# Patient Record
Sex: Female | Born: 1950 | Race: White | Hispanic: No | Smoking: Never smoker
Health system: Southern US, Community
[De-identification: ages and names within clinical notes are randomized; demographics above are authoritative.]

## PROBLEM LIST (undated history)

## (undated) DIAGNOSIS — R0981 Nasal congestion: Secondary | ICD-10-CM

## (undated) DIAGNOSIS — N281 Cyst of kidney, acquired: Secondary | ICD-10-CM

## (undated) DIAGNOSIS — I471 Supraventricular tachycardia, unspecified: Secondary | ICD-10-CM

## (undated) DIAGNOSIS — Z8719 Personal history of other diseases of the digestive system: Secondary | ICD-10-CM

## (undated) DIAGNOSIS — M8580 Other specified disorders of bone density and structure, unspecified site: Secondary | ICD-10-CM

## (undated) DIAGNOSIS — M810 Age-related osteoporosis without current pathological fracture: Secondary | ICD-10-CM

## (undated) DIAGNOSIS — Z87442 Personal history of urinary calculi: Secondary | ICD-10-CM

## (undated) DIAGNOSIS — E778 Other disorders of glycoprotein metabolism: Secondary | ICD-10-CM

## (undated) DIAGNOSIS — E7800 Pure hypercholesterolemia, unspecified: Secondary | ICD-10-CM

## (undated) DIAGNOSIS — M1990 Unspecified osteoarthritis, unspecified site: Secondary | ICD-10-CM

## (undated) DIAGNOSIS — G8929 Other chronic pain: Secondary | ICD-10-CM

## (undated) DIAGNOSIS — K219 Gastro-esophageal reflux disease without esophagitis: Secondary | ICD-10-CM

## (undated) HISTORY — DX: Cyst of kidney, acquired: N28.1

## (undated) HISTORY — DX: Personal history of other diseases of the digestive system: Z87.19

## (undated) HISTORY — PX: COLONOSCOPY: SHX174

## (undated) HISTORY — DX: Pure hypercholesterolemia, unspecified: E78.00

## (undated) HISTORY — DX: Other chronic pain: G89.29

## (undated) HISTORY — DX: Gastro-esophageal reflux disease without esophagitis: K21.9

## (undated) HISTORY — DX: Age-related osteoporosis without current pathological fracture: M81.0

## (undated) HISTORY — PX: LAPAROSCOPY: SHX197

## (undated) HISTORY — DX: Other disorders of glycoprotein metabolism: E77.8

---
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
## 1998-04-03 ENCOUNTER — Ambulatory Visit (HOSPITAL_COMMUNITY): Admission: RE | Admit: 1998-04-03 | Discharge: 1998-04-03 | Payer: Self-pay | Admitting: Obstetrics and Gynecology

## 1998-04-03 ENCOUNTER — Encounter: Payer: Self-pay | Admitting: Obstetrics and Gynecology

## 1998-06-19 ENCOUNTER — Ambulatory Visit (HOSPITAL_COMMUNITY): Admission: RE | Admit: 1998-06-19 | Discharge: 1998-06-19 | Payer: Self-pay | Admitting: Obstetrics and Gynecology

## 1999-05-11 ENCOUNTER — Ambulatory Visit (HOSPITAL_COMMUNITY): Admission: RE | Admit: 1999-05-11 | Discharge: 1999-05-11 | Payer: Self-pay | Admitting: Obstetrics and Gynecology

## 1999-05-11 ENCOUNTER — Encounter: Payer: Self-pay | Admitting: Obstetrics and Gynecology

## 2000-07-11 ENCOUNTER — Ambulatory Visit (HOSPITAL_COMMUNITY): Admission: RE | Admit: 2000-07-11 | Discharge: 2000-07-11 | Payer: Self-pay | Admitting: Obstetrics and Gynecology

## 2000-07-11 ENCOUNTER — Encounter: Payer: Self-pay | Admitting: Obstetrics and Gynecology

## 2001-10-29 ENCOUNTER — Ambulatory Visit (HOSPITAL_COMMUNITY): Admission: RE | Admit: 2001-10-29 | Discharge: 2001-10-29 | Payer: Self-pay | Admitting: Obstetrics and Gynecology

## 2001-10-29 ENCOUNTER — Encounter: Payer: Self-pay | Admitting: Obstetrics and Gynecology

## 2002-03-04 ENCOUNTER — Encounter: Admission: RE | Admit: 2002-03-04 | Discharge: 2002-03-04 | Payer: Self-pay | Admitting: Obstetrics and Gynecology

## 2002-03-04 ENCOUNTER — Encounter: Payer: Self-pay | Admitting: Obstetrics and Gynecology

## 2003-01-24 ENCOUNTER — Ambulatory Visit (HOSPITAL_COMMUNITY): Admission: RE | Admit: 2003-01-24 | Discharge: 2003-01-24 | Payer: Self-pay | Admitting: Obstetrics and Gynecology

## 2003-01-24 ENCOUNTER — Encounter: Payer: Self-pay | Admitting: Obstetrics and Gynecology

## 2004-02-14 ENCOUNTER — Ambulatory Visit (HOSPITAL_COMMUNITY): Admission: RE | Admit: 2004-02-14 | Discharge: 2004-02-14 | Payer: Self-pay | Admitting: Obstetrics and Gynecology

## 2005-03-05 ENCOUNTER — Ambulatory Visit (HOSPITAL_COMMUNITY): Admission: RE | Admit: 2005-03-05 | Discharge: 2005-03-05 | Payer: Self-pay | Admitting: Obstetrics and Gynecology

## 2005-03-28 ENCOUNTER — Ambulatory Visit (HOSPITAL_COMMUNITY): Admission: RE | Admit: 2005-03-28 | Discharge: 2005-03-28 | Payer: Self-pay | Admitting: Obstetrics and Gynecology

## 2005-04-08 ENCOUNTER — Ambulatory Visit (HOSPITAL_COMMUNITY): Admission: RE | Admit: 2005-04-08 | Discharge: 2005-04-08 | Payer: Self-pay | Admitting: Gastroenterology

## 2005-09-27 ENCOUNTER — Ambulatory Visit (HOSPITAL_COMMUNITY): Admission: RE | Admit: 2005-09-27 | Discharge: 2005-09-27 | Payer: Self-pay | Admitting: Anesthesiology

## 2005-10-08 ENCOUNTER — Ambulatory Visit (HOSPITAL_COMMUNITY): Admission: RE | Admit: 2005-10-08 | Discharge: 2005-10-08 | Payer: Self-pay | Admitting: Urology

## 2006-04-09 ENCOUNTER — Ambulatory Visit (HOSPITAL_COMMUNITY): Admission: RE | Admit: 2006-04-09 | Discharge: 2006-04-09 | Payer: Self-pay | Admitting: Obstetrics and Gynecology

## 2006-07-09 ENCOUNTER — Ambulatory Visit (HOSPITAL_COMMUNITY): Admission: RE | Admit: 2006-07-09 | Discharge: 2006-07-09 | Payer: Self-pay | Admitting: Unknown Physician Specialty

## 2007-04-07 ENCOUNTER — Encounter: Admission: RE | Admit: 2007-04-07 | Discharge: 2007-04-27 | Payer: Self-pay | Admitting: Family Medicine

## 2007-04-28 ENCOUNTER — Ambulatory Visit (HOSPITAL_COMMUNITY): Admission: RE | Admit: 2007-04-28 | Discharge: 2007-04-28 | Payer: Self-pay | Admitting: Sports Medicine

## 2007-08-05 ENCOUNTER — Ambulatory Visit (HOSPITAL_COMMUNITY): Admission: RE | Admit: 2007-08-05 | Discharge: 2007-08-05 | Payer: Self-pay | Admitting: Family Medicine

## 2008-06-07 ENCOUNTER — Other Ambulatory Visit: Admission: RE | Admit: 2008-06-07 | Discharge: 2008-06-07 | Payer: Self-pay | Admitting: Family Medicine

## 2008-07-06 ENCOUNTER — Ambulatory Visit (HOSPITAL_COMMUNITY): Admission: RE | Admit: 2008-07-06 | Discharge: 2008-07-06 | Payer: Self-pay | Admitting: Family Medicine

## 2008-11-16 ENCOUNTER — Ambulatory Visit (HOSPITAL_COMMUNITY): Admission: RE | Admit: 2008-11-16 | Discharge: 2008-11-16 | Payer: Self-pay | Admitting: Family Medicine

## 2010-02-20 ENCOUNTER — Ambulatory Visit (HOSPITAL_COMMUNITY): Admission: RE | Admit: 2010-02-20 | Discharge: 2010-02-20 | Payer: Self-pay | Admitting: Family Medicine

## 2010-09-12 ENCOUNTER — Other Ambulatory Visit (HOSPITAL_COMMUNITY): Payer: Self-pay | Admitting: Family Medicine

## 2010-09-14 NOTE — Op Note (Signed)
NAME:  Sandra Park, Sandra Park               ACCOUNT NO.:  [id number]   MEDICAL RECORD NO.:  [id number]          PATIENT TYPE:  AMB   LOCATION:  ENDO                         FACILITY[facility]  [facility]   PHYSICIAN:  Shirley Friar, MDDATE OF BIRTH:  1950/12/17   DATE OF PROCEDURE:  04/08/2005  DATE OF DISCHARGE:                                 OPERATIVE REPORT   PROCEDURE:  Colonoscopy.   INDICATIONS FOR PROCEDURE:  Screening.   MEDICATIONS:  Demerol 60 mg IV, Versed 7 mg IV.   EXAM:  Rectal exam was normal.   The pediatric adjustable colonoscope was inserted into a well prepped colon  and advanced to the cecum where the ileocecal valve and appendiceal orifice  were identified. The terminal ileum was intubated and was normal in  appearance. On careful withdrawal, the colonoscope revealed normal mucosa  and no polyps or lesions noted. Retroflexion revealed small internal  hemorrhoids otherwise normal colonoscopy. Insufflated air was aspirated in  the rectum and the scope was withdrawn and confirmed the above findings.   ASSESSMENT:  Small internal hemorrhoids otherwise normal colonoscopy.   PLAN:  1.  High fiber diet as needed.  2.  Repeat colonoscopy in 10 years for screening purposes.      Shirley Friar, MD  Electronically Signed     VCS/MEDQ  D:  04/08/2005  T:  04/08/2005  Job:  [id number]   cc:   Sigmund Hazel, M.D.  Fax: [phone]

## 2010-09-19 ENCOUNTER — Other Ambulatory Visit (HOSPITAL_COMMUNITY): Payer: Self-pay | Admitting: Family Medicine

## 2010-09-19 ENCOUNTER — Ambulatory Visit (HOSPITAL_COMMUNITY)
Admission: RE | Admit: 2010-09-19 | Discharge: 2010-09-19 | Disposition: A | Discharge status: Home / Self Care | Payer: 59 | Source: Ambulatory Visit | Attending: Family Medicine | Admitting: Family Medicine

## 2010-09-19 DIAGNOSIS — Z13820 Encounter for screening for osteoporosis: Secondary | ICD-10-CM | POA: Insufficient documentation

## 2010-09-19 DIAGNOSIS — Z780 Asymptomatic menopausal state: Secondary | ICD-10-CM | POA: Insufficient documentation

## 2011-03-06 ENCOUNTER — Other Ambulatory Visit (HOSPITAL_COMMUNITY): Payer: Self-pay | Admitting: Family Medicine

## 2011-03-06 DIAGNOSIS — Z1231 Encounter for screening mammogram for malignant neoplasm of breast: Secondary | ICD-10-CM

## 2011-04-10 ENCOUNTER — Ambulatory Visit (HOSPITAL_COMMUNITY)
Admission: RE | Admit: 2011-04-10 | Discharge: 2011-04-10 | Disposition: A | Discharge status: Home / Self Care | Payer: 59 | Source: Ambulatory Visit | Attending: Family Medicine | Admitting: Family Medicine

## 2011-04-10 DIAGNOSIS — Z1231 Encounter for screening mammogram for malignant neoplasm of breast: Secondary | ICD-10-CM | POA: Insufficient documentation

## 2012-01-19 SURGERY — LEFT HEART CATH
Anesthesia: LOCAL | Laterality: Right

## 2012-03-02 ENCOUNTER — Other Ambulatory Visit (HOSPITAL_COMMUNITY): Payer: Self-pay | Admitting: Family Medicine

## 2012-03-02 DIAGNOSIS — Z1231 Encounter for screening mammogram for malignant neoplasm of breast: Secondary | ICD-10-CM

## 2012-04-10 ENCOUNTER — Ambulatory Visit (HOSPITAL_COMMUNITY): Payer: 59

## 2012-06-17 ENCOUNTER — Ambulatory Visit (HOSPITAL_COMMUNITY)
Admission: RE | Admit: 2012-06-17 | Discharge: 2012-06-17 | Disposition: A | Discharge status: Home / Self Care | Payer: 59 | Source: Ambulatory Visit | Attending: Family Medicine | Admitting: Family Medicine

## 2012-06-17 DIAGNOSIS — Z1231 Encounter for screening mammogram for malignant neoplasm of breast: Secondary | ICD-10-CM | POA: Insufficient documentation

## 2012-11-25 ENCOUNTER — Other Ambulatory Visit: Payer: Self-pay | Admitting: Family Medicine

## 2012-11-25 ENCOUNTER — Other Ambulatory Visit (HOSPITAL_COMMUNITY): Payer: Self-pay | Admitting: Family Medicine

## 2012-11-25 ENCOUNTER — Other Ambulatory Visit (HOSPITAL_COMMUNITY)
Admission: RE | Admit: 2012-11-25 | Discharge: 2012-11-25 | Disposition: A | Discharge status: Home / Self Care | Payer: 59 | Source: Ambulatory Visit | Attending: Family Medicine | Admitting: Family Medicine

## 2012-11-25 DIAGNOSIS — Z1151 Encounter for screening for human papillomavirus (HPV): Secondary | ICD-10-CM | POA: Insufficient documentation

## 2012-11-25 DIAGNOSIS — Z124 Encounter for screening for malignant neoplasm of cervix: Secondary | ICD-10-CM | POA: Insufficient documentation

## 2012-11-25 DIAGNOSIS — M8580 Other specified disorders of bone density and structure, unspecified site: Secondary | ICD-10-CM

## 2012-11-27 ENCOUNTER — Other Ambulatory Visit (HOSPITAL_COMMUNITY): Payer: Self-pay | Admitting: Family Medicine

## 2012-11-27 ENCOUNTER — Ambulatory Visit (HOSPITAL_COMMUNITY)
Admission: RE | Admit: 2012-11-27 | Discharge: 2012-11-27 | Disposition: A | Discharge status: Home / Self Care | Payer: 59 | Source: Ambulatory Visit | Attending: Family Medicine | Admitting: Family Medicine

## 2012-11-27 DIAGNOSIS — Z780 Asymptomatic menopausal state: Secondary | ICD-10-CM | POA: Insufficient documentation

## 2012-11-27 DIAGNOSIS — M8580 Other specified disorders of bone density and structure, unspecified site: Secondary | ICD-10-CM

## 2012-11-27 DIAGNOSIS — Z13820 Encounter for screening for osteoporosis: Secondary | ICD-10-CM | POA: Insufficient documentation

## 2013-05-07 ENCOUNTER — Encounter: Payer: Self-pay | Admitting: Emergency Medicine

## 2013-05-07 ENCOUNTER — Emergency Department
Admission: EM | Admit: 2013-05-07 | Discharge: 2013-05-07 | Disposition: A | Discharge status: Home / Self Care | Payer: 59 | Source: Home / Self Care | Attending: Family Medicine | Admitting: Family Medicine

## 2013-05-07 DIAGNOSIS — J069 Acute upper respiratory infection, unspecified: Secondary | ICD-10-CM

## 2013-05-07 HISTORY — DX: Unspecified osteoarthritis, unspecified site: M19.90

## 2013-05-07 HISTORY — DX: Personal history of urinary calculi: Z87.442

## 2013-05-07 HISTORY — DX: Other specified disorders of bone density and structure, unspecified site: M85.80

## 2013-05-07 MED ORDER — AZITHROMYCIN 250 MG PO TABS: ORAL_TABLET | ORAL | Status: DC

## 2013-05-07 MED ORDER — BENZONATATE 200 MG PO CAPS: 200.0000 mg | ORAL_CAPSULE | Freq: Every day | ORAL | Status: DC

## 2013-05-07 NOTE — ED Provider Notes (Signed)
CSN: [id number]     Arrival date & time 05/07/13  4193 History   First MD Initiated Contact with Patient 05/07/13 [phone]     Chief Complaint  Patient presents with  . URI      HPI Comments: Patient complains of 5 day history of sinus congestion, minimal sore throat, and non-productive cough.  She had chills last night.  The history is provided by the patient.    Past Medical History  Diagnosis Date  . History of kidney stones   . Osteopenia   . Arthritis    Past Surgical History  Procedure Laterality Date  . Laparoscopy     Family History  Problem Relation Age of Onset  . Atrial fibrillation Mother   . Hypertension Mother   . Arthritis Mother   . Atrial fibrillation Father   . Hypertension Father   . Arthritis Father    History  Substance Use Topics  . Smoking status: Never Smoker   . Smokeless tobacco: Never Used  . Alcohol Use: No   OB History   Grav Para Term Preterm Abortions TAB SAB Ect Mult Living                 Review of Systems + minimal sore throat + cough No pleuritic pain No wheezing + nasal congestion + post-nasal drainage No sinus pain/pressure No itchy/red eyes No earache, but right ear feels clogged. No hemoptysis No SOB No fever, + chills No nausea No vomiting No abdominal pain No diarrhea No urinary symptoms No skin rash + fatigue No myalgias + headache Used OTC meds without relief  Allergies  Review of patient's allergies indicates no known allergies.  Home Medications   Current Outpatient Rx  Name  Route  Sig  Dispense  Refill  . aspirin EC 81 MG tablet   Oral   Take 81 mg by mouth daily.         . Calcium Carbonate (CALCIUM 600 PO)   Oral   Take by mouth.         . Cholecalciferol (VITAMIN D-3 PO)   Oral   Take 2,000 Units by mouth.         Marland Kitchen glucosamine-chondroitin 500-400 MG tablet   Oral   Take 1 tablet by mouth 3 (three) times daily.         . Multiple Vitamin (MULTIVITAMIN) tablet   Oral   Take 1  tablet by mouth daily.         Marland Kitchen omega-3 acid ethyl esters (LOVAZA) 1 G capsule   Oral   Take by mouth 2 (two) times daily.         Marland Kitchen azithromycin (ZITHROMAX Z-PAK) 250 MG tablet      Take 2 tabs today; then begin one tab once daily for 4 more days. (Rx void after 05/15/13)   6 each   0   . benzonatate (TESSALON) 200 MG capsule   Oral   Take 1 capsule (200 mg total) by mouth at bedtime. Take as needed for cough   12 capsule   0    BP 127/70  Pulse 94  Temp(Src) 99.1 F (37.3 C) (Oral)  Resp 16  Ht 5\' 6"  (1.676 m)  Wt 116 lb (52.617 kg)  BMI 18.73 kg/m2  SpO2 97% Physical Exam Nursing notes and Vital Signs reviewed. Appearance:  Patient appears healthy, stated age, and in no acute distress Eyes:  Pupils are equal, round, and reactive to light and accomodation.  Extraocular movement is intact.  Conjunctivae are not inflamed  Ears:  Canals normal.  Tympanic membranes normal.  Nose:  Mildly congested turbinates.  No sinus tenderness.   Pharynx:  Normal Neck:  Supple.  Slightly tender shotty posterior nodes are palpated bilaterally  Lungs:  Clear to auscultation.  Breath sounds are equal.  Heart:  Regular rate and rhythm without murmurs, rubs, or gallops.  Abdomen:  Nontender without masses or hepatosplenomegaly.  Bowel sounds are present.  No CVA or flank tenderness.  Extremities:  No edema.  No calf tenderness Skin:  No rash present.   ED Course  Procedures  None    MDM   1. Acute upper respiratory infections of unspecified site; suspect viral URI    There is no evidence of bacterial infection today.  Treat symptomatically for now: Prescription written for Benzonatate [facility]) to take at bedtime for night-time cough.  Take plain Mucinex (1200 mg guaifenesin) twice daily for cough and congestion.  May add Sudafed as needed for sinus congestion.  Increase fluid intake, rest. May use Afrin nasal spray (or generic oxymetazoline) twice daily for about 5 days.  Also  recommend using saline nasal spray several times daily and saline nasal irrigation (AYR is a common brand) Stop all antihistamines for now, and other non-prescription cough/cold preparations. May take Ibuprofen 200mg , 4 tabs every 8 hours with food for headache, fever, etc. Begin Azithromycin if not improving about 5 days or if persistent fever develops (Given a prescription to hold, with an expiration date)  Follow-up with family doctor if not improving 7 to 10 days.     Kandra Nicolas, MD 05/09/13 [phone]

## 2013-05-07 NOTE — Discharge Instructions (Signed)
Take plain Mucinex (1200 mg guaifenesin) twice daily for cough and congestion.  May add Sudafed as needed for sinus congestion.  Increase fluid intake, rest. May use Afrin nasal spray (or generic oxymetazoline) twice daily for about 5 days.  Also recommend using saline nasal spray several times daily and saline nasal irrigation (AYR is a common brand) Stop all antihistamines for now, and other non-prescription cough/cold preparations. May take Ibuprofen 200mg , 4 tabs every 8 hours with food for headache, fever, etc. Begin Azithromycin if not improving about 5 days or if persistent fever develops   Follow-up with family doctor if not improving 7 to 10 days.

## 2013-05-07 NOTE — ED Notes (Signed)
Pt c/o dry cough, runny nose, congestion, "burning" in chest x 5 days. Denies fever. Received flu vac this season.

## 2013-05-13 ENCOUNTER — Other Ambulatory Visit: Payer: Self-pay | Admitting: Dermatology

## 2013-07-20 ENCOUNTER — Other Ambulatory Visit (HOSPITAL_COMMUNITY): Payer: Self-pay | Admitting: Family Medicine

## 2013-07-20 ENCOUNTER — Ambulatory Visit (HOSPITAL_COMMUNITY)
Admission: RE | Admit: 2013-07-20 | Discharge: 2013-07-20 | Disposition: A | Discharge status: Home / Self Care | Payer: 59 | Source: Ambulatory Visit | Attending: Family Medicine | Admitting: Family Medicine

## 2013-07-20 DIAGNOSIS — Z1231 Encounter for screening mammogram for malignant neoplasm of breast: Secondary | ICD-10-CM | POA: Insufficient documentation

## 2013-07-20 DIAGNOSIS — Z0000 Encounter for general adult medical examination without abnormal findings: Secondary | ICD-10-CM

## 2013-09-23 ENCOUNTER — Encounter: Payer: Self-pay | Admitting: Internal Medicine

## 2013-11-11 ENCOUNTER — Encounter: Payer: Self-pay | Admitting: Gastroenterology

## 2013-11-29 ENCOUNTER — Encounter: Payer: Self-pay | Admitting: Internal Medicine

## 2013-11-29 ENCOUNTER — Ambulatory Visit (INDEPENDENT_AMBULATORY_CARE_PROVIDER_SITE_OTHER): Payer: 59 | Admitting: Internal Medicine

## 2013-11-29 VITALS — BP 100/60 | HR 72 | Ht 66.0 in | Wt 117.0 lb

## 2013-11-29 DIAGNOSIS — E778 Other disorders of glycoprotein metabolism: Secondary | ICD-10-CM | POA: Insufficient documentation

## 2013-11-29 DIAGNOSIS — E8809 Other disorders of plasma-protein metabolism, not elsewhere classified: Secondary | ICD-10-CM

## 2013-11-29 DIAGNOSIS — R609 Edema, unspecified: Secondary | ICD-10-CM

## 2013-11-29 NOTE — Patient Instructions (Signed)
Your physician has requested that you go to the basement for the following lab work before leaving today: Stool studies  I appreciate the opportunity to care for you.

## 2013-11-29 NOTE — Progress Notes (Signed)
[facility]  [place]    [id number]    [birth year]    Assessment and Plan/Recommendations:  Hypoproteinemia:  Perform Alpha-1 antitrypsin in the stool to assess for GI loss. If elevated will need further GI evaluation Edema: Secondary to protein loss.     HPI:  Sandra Park is a 62 y.o. 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 Carlean Purl) with hx of OA and GERD referred from [facility] at [facility] for evaluation of hypoproteinemia.  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  She was seen by her PCP with unremarkable findings. In Nov-Dec of 2014 pt began to notice peripheral edema greater in her Left leg. 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  She was seen by her PCP in April and work up showed low protein.  She added more protein to her diet with some relief, having a corrected protein level on a f/u visit, which again dropped on the next visit.  She was evaluated for loss of protein in urine with a 24 hr protein test which proved negative, so was referred for GI evaluation.  Pt denies N/V, Diarrhea, greasy or foul smelling stools, hematochezia, melana, hematemesis, recent weight loss fever, ABD pain, and night sweats.  She does c/o occasional chills while at work, increase flatulence, and occasional dizzy spells relieved by hydration.  She has had normal EKG and chest x rays, and has no other CHF related complaints.  She also denies urinary changes.  09/05/2010 Total protein 7 albumin 4.9 07/28/2013 NL CBC, TSH Total protein is 4.8 and albumin 3.1 (NLS are 6-8.3 and 3.4-4.8) 08/04/2013 - TP 4.5 albumin 2.9 08/09/2013 no protein in 24 hr urine 08/16/2013 TP 4.9, alb 3.4 09/13/2013 TP 4.5 and alb 3   Outpatient Encounter Prescriptions as of 11/29/2013  Medication Sig  . aspirin EC 81 MG tablet Take 81 mg by mouth daily.  . Calcium Carbonate  (CALCIUM 600 PO) Take by mouth.  . Cholecalciferol (VITAMIN D-3 PO) Take 2,000 Units by mouth.  . famotidine (PEPCID) 20 MG tablet Take 20 mg by mouth daily.  . furosemide (LASIX) 20 MG tablet Take 20 mg by mouth.  Marland Kitchen glucosamine-chondroitin 500-400 MG tablet Take 1 tablet by mouth 3 (three) times daily.  . Multiple Vitamin (MULTIVITAMIN) tablet Take 1 tablet by mouth daily.  Marland Kitchen omega-3 acid ethyl esters (LOVAZA) 1 G capsule Take by mouth 2 (two) times daily.  . vitamin C (ASCORBIC ACID) 500 MG tablet Take 500 mg by mouth daily.  . [DISCONTINUED] azithromycin (ZITHROMAX Z-PAK) 250 MG tablet Take 2 tabs today; then begin one tab once daily for 4 more days. (Rx void after 05/15/13)  . [DISCONTINUED] benzonatate (TESSALON) 200 MG capsule Take 1 capsule (200 mg total) by mouth at bedtime. Take as needed for cough    Allergies as of 11/29/2013  . (No Known Allergies)    Past Medical History  Diagnosis Date  . History of kidney stones   . Osteopenia   . Arthritis   . Hypoproteinemia   . GERD (gastroesophageal reflux disease)   . Osteoporosis   . Renal cyst     Past Surgical History  Procedure Laterality Date  . Laparoscopy Left     salpingoopherectomy cyst x2    Family History  Problem Relation Age of Onset  . Atrial fibrillation Mother   .  Hypertension Mother   . Arthritis Mother   . Atrial fibrillation Father   . Hypertension Father   . Arthritis Father   . Heart attack Father   . Clotting disorder Father     DVT's  . Pneumonia Mother   . Suicidality Paternal Grandmother   . Hypertension Maternal Grandfather   . Heart disease Maternal Grandfather     History   Social History  . Marital Status: Married    Spouse Name: N/A    Number of Children: N/A  . Years of Education: N/A   Occupational History  . Not on file.   Social History Main Topics  . Smoking status: Never Smoker   . Smokeless tobacco: Never Used  . Alcohol Use: No  . Drug Use: No  . Sexual  Activity: Not on file   Other Topics Concern  . 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[employer]   No children   3-4 caffeine drinks/day          Review of systems: Positive for: bloating, flatulence, and occasional chills All other ROS negative or as per HPI  Physical Exam: BP 100/60  Pulse 72  Ht 5\' 6"  (1.676 m)  Wt 117 lb (53.071 kg)  BMI 18.89 kg/m2 Constitutional: WDWN NAD Eyes: anicteric Mouth: oral and posterior pharynx free of lesions Neck: supple, no mass or thyromegaly Lungs: clear to auscultation bilaterally Cardiovascular: S1S2 with regular rate and rhythm, no rubs murmurs or gallops Abdomen: soft, nontender, nondistended, no masses or organomegaly, normal bowel sounds Extremities: 2+ peripheral edema, no joint deformities from OA. 2+ radial pulse 1+ DP pulse. Skin: no rash Neuro: alert and oriented x 3 Psych: normal mood and affect  Data Reviewed: Lab results from PCP. Normal 24 hr urine protein Good cholesterol panel.  Oda Kilts,  [place], [place] Student  11:01 AM       I have personally seen the patient, reviewed and repeated key elements of the history and physical and participated in formation of the assessment and plan the student has documented.   I appreciate the opportunity to care for her.  Gatha Mayer, MD, [facility]   IO:MBTDHR,CBUL Jeani Hawking, MD

## 2013-11-30 ENCOUNTER — Other Ambulatory Visit: Payer: 59

## 2013-11-30 DIAGNOSIS — E778 Other disorders of glycoprotein metabolism: Secondary | ICD-10-CM

## 2013-12-04 LAB — ALPHA-1-ANTITRYPSIN, STOOL: Alpha-1-Antitrypsin, Feces: <25 mg/dL (ref <55)

## 2013-12-06 NOTE — Progress Notes (Signed)
Quick Note:  Let her know that she is not losing protein from her gut. No further GI work-up. Please also cc her PCP ______

## 2014-08-16 ENCOUNTER — Other Ambulatory Visit (HOSPITAL_COMMUNITY): Payer: Self-pay | Admitting: Family Medicine

## 2014-08-16 DIAGNOSIS — Z1231 Encounter for screening mammogram for malignant neoplasm of breast: Secondary | ICD-10-CM

## 2014-08-18 ENCOUNTER — Ambulatory Visit (HOSPITAL_COMMUNITY)
Admission: RE | Admit: 2014-08-18 | Discharge: 2014-08-18 | Disposition: A | Discharge status: Home / Self Care | Payer: 59 | Source: Ambulatory Visit | Attending: Family Medicine | Admitting: Family Medicine

## 2014-08-18 DIAGNOSIS — Z1231 Encounter for screening mammogram for malignant neoplasm of breast: Secondary | ICD-10-CM | POA: Diagnosis not present

## 2014-09-06 DIAGNOSIS — Z8719 Personal history of other diseases of the digestive system: Secondary | ICD-10-CM

## 2014-09-06 HISTORY — DX: Personal history of other diseases of the digestive system: Z87.19

## 2014-10-24 ENCOUNTER — Other Ambulatory Visit: Payer: Self-pay

## 2014-10-25 ENCOUNTER — Telehealth: Payer: Self-pay | Admitting: Internal Medicine

## 2014-10-26 ENCOUNTER — Encounter: Payer: Self-pay | Admitting: Internal Medicine

## 2014-10-26 NOTE — Telephone Encounter (Signed)
Yes OK to set up a direct colonoscopy and can use screening as dx

## 2014-10-27 ENCOUNTER — Encounter: Payer: Self-pay | Admitting: Internal Medicine

## 2014-10-27 NOTE — Telephone Encounter (Signed)
Appointment scheduled w/pt for 12-22-14.

## 2014-12-12 ENCOUNTER — Telehealth: Payer: Self-pay | Admitting: *Deleted

## 2014-12-12 ENCOUNTER — Ambulatory Visit (AMBULATORY_SURGERY_CENTER): Payer: Self-pay | Admitting: *Deleted

## 2014-12-12 VITALS — Ht 66.0 in | Wt 120.0 lb

## 2014-12-12 DIAGNOSIS — R634 Abnormal weight loss: Secondary | ICD-10-CM | POA: Insufficient documentation

## 2014-12-12 DIAGNOSIS — M25569 Pain in unspecified knee: Secondary | ICD-10-CM | POA: Insufficient documentation

## 2014-12-12 DIAGNOSIS — R350 Frequency of micturition: Secondary | ICD-10-CM | POA: Insufficient documentation

## 2014-12-12 DIAGNOSIS — K219 Gastro-esophageal reflux disease without esophagitis: Secondary | ICD-10-CM | POA: Insufficient documentation

## 2014-12-12 DIAGNOSIS — Z1211 Encounter for screening for malignant neoplasm of colon: Secondary | ICD-10-CM

## 2014-12-12 DIAGNOSIS — M810 Age-related osteoporosis without current pathological fracture: Secondary | ICD-10-CM | POA: Insufficient documentation

## 2014-12-12 NOTE — Telephone Encounter (Signed)
Patient here in pre-visit, she is for screening colonoscopy on 12/22/14. Patient has seen you in office 2015. Patient had last colon with [facility] in 2006. Patient states she is unable to drink anything flavored, unable to drink Gatorade,etc... She states last colon she had the miralax mixed with water only and she did fine. Okay for patient to use just water with miralax or any suggestions? Thanks, Tamarick Kovalcik pv

## 2014-12-12 NOTE — Progress Notes (Signed)
Patient denies any allergies to eggs or soy. Patient denies any problems with anesthesia/sedation. Patient denies any oxygen use at home and does not take any diet/weight loss medications. EMMI education assisgned to patient on colonoscopy, this was explained and instructions given to patient. 

## 2014-12-13 ENCOUNTER — Telehealth: Payer: Self-pay | Admitting: *Deleted

## 2014-12-13 NOTE — Telephone Encounter (Signed)
Called pt back, and advised Dr Carlean Purl ok'd to mix miralax in water.-adm

## 2014-12-13 NOTE — Telephone Encounter (Signed)
OK to take MiraLax with water

## 2014-12-14 NOTE — Telephone Encounter (Signed)
Noted. Thanks! See phone note by April,RN.

## 2014-12-22 ENCOUNTER — Ambulatory Visit (AMBULATORY_SURGERY_CENTER): Payer: 59 | Admitting: Internal Medicine

## 2014-12-22 ENCOUNTER — Encounter: Payer: Self-pay | Admitting: Internal Medicine

## 2014-12-22 VITALS — BP 138/58 | HR 64 | Temp 97.6°F | Resp 15 | Ht 66.0 in | Wt 120.0 lb

## 2014-12-22 DIAGNOSIS — Z1211 Encounter for screening for malignant neoplasm of colon: Secondary | ICD-10-CM

## 2014-12-22 MED ORDER — SODIUM CHLORIDE 0.9 % IV SOLN: 500.0000 mL | INTRAVENOUS | Status: DC

## 2014-12-22 NOTE — Op Note (Signed)
[place]  [address]. [place], [id number]   COLONOSCOPY PROCEDURE REPORT  PATIENT: Sandra, Park  MR#: [id number] BIRTHDATE: December 22, 1950 , 36  yrs. old GENDER: female ENDOSCOPIST: Gatha Mayer, MD, [facility] PROCEDURE DATE:  12/22/2014 PROCEDURE:   Colonoscopy, screening First Screening Colonoscopy - Avg.  risk and is 50 yrs.  old or older - No.  Prior Negative Screening - Now for repeat screening. 10 or more years since last screening  History of Adenoma - Now for follow-up colonoscopy & has been > or = to 3 yrs.  N/A  Polyps removed today? No Recommend repeat exam, <10 yrs? No ASA CLASS:   Class II INDICATIONS:Screening for colonic neoplasia and Colorectal Neoplasm Risk Assessment for this procedure is average risk. MEDICATIONS: Propofol 200 mg IV and Monitored anesthesia care  DESCRIPTION OF PROCEDURE:   After the risks benefits and alternatives of the procedure were thoroughly explained, informed consent was obtained.  The digital rectal exam revealed no abnormalities of the rectum.   The [id number]  endoscope was introduced through the anus and advanced to the terminal ileum which was intubated for a short distance. No adverse events experienced.   The quality of the prep was excellent.  (MiraLax was used)  The instrument was then slowly withdrawn as the colon was fully examined. Estimated blood loss is zero unless otherwise noted in this procedure report.      COLON FINDINGS: A normal appearing cecum, ileocecal valve, and appendiceal orifice were identified.  The ascending, transverse, descending, sigmoid colon, and rectum appeared unremarkable.   The examined terminal ileum appeared to be normal.  Retroflexed views revealed no abnormalities. The time to cecum = 3.7 Withdrawal time = 6.9   The scope was withdrawn and the procedure completed. COMPLICATIONS: There were no immediate complications.  ENDOSCOPIC IMPRESSION: Normal colonoscopy  and terminal ileum exam. excellent prep  RECOMMENDATIONS: Repeat colonoscopy/screening test 10 years - 2026  eSigned:  Gatha Mayer, MD, [facility] 12/22/2014 2:02 PM   cc: Kathyrn Lass, MD and The Patient

## 2014-12-22 NOTE — Progress Notes (Signed)
Report to PACU, RN, vss, BBS= Clear.  

## 2014-12-22 NOTE — Patient Instructions (Addendum)
Normal exam!  Next routine colonoscopy/screening test in 10 years - 2026  I appreciate the opportunity to care for you. Gatha Mayer, MD, FACG  YOU HAD AN ENDOSCOPIC PROCEDURE TODAY AT [facility]:   Refer to the procedure report that was given to you for any specific questions about what was found during the examination.  If the procedure report does not answer your questions, please call your gastroenterologist to clarify.  If you requested that your care partner not be given the details of your procedure findings, then the procedure report has been included in a sealed envelope for you to review at your convenience later.  YOU SHOULD EXPECT: Some feelings of bloating in the abdomen. Passage of more gas than usual.  Walking can help get rid of the air that was put into your GI tract during the procedure and reduce the bloating. If you had a lower endoscopy (such as a colonoscopy or flexible sigmoidoscopy) you may notice spotting of blood in your stool or on the toilet paper. If you underwent a bowel prep for your procedure, you may not have a normal bowel movement for a few days.  Please Note:  You might notice some irritation and congestion in your nose or some drainage.  This is from the oxygen used during your procedure.  There is no need for concern and it should clear up in a day or so.  SYMPTOMS TO REPORT IMMEDIATELY:   Following lower endoscopy (colonoscopy or flexible sigmoidoscopy):  Excessive amounts of blood in the stool  Significant tenderness or worsening of abdominal pains  Swelling of the abdomen that is new, acute  Fever of 100F or higher  For urgent or emergent issues, a gastroenterologist can be reached at any hour by calling [phone].   DIET: Your first meal following the procedure should be a small meal and then it is ok to progress to your normal diet. Heavy or fried foods are harder to digest and may make you feel nauseous or  bloated.  Likewise, meals heavy in dairy and vegetables can increase bloating.  Drink plenty of fluids but you should avoid alcoholic beverages for 24 hours.  ACTIVITY:  You should plan to take it easy for the rest of today and you should NOT DRIVE or use heavy machinery until tomorrow (because of the sedation medicines used during the test).    FOLLOW UP: Our staff will call the number listed on your records the next business day following your procedure to check on you and address any questions or concerns that you may have regarding the information given to you following your procedure. If we do not reach you, we will leave a message.  However, if you are feeling well and you are not experiencing any problems, there is no need to return our call.  We will assume that you have returned to your regular daily activities without incident.  If any biopsies were taken you will be contacted by phone or by letter within the next 1-3 weeks.  Please call us at [phone] if you have not heard about the biopsies in 3 weeks.    SIGNATURES/CONFIDENTIALITY: You and/or your care partner have signed paperwork which will be entered into your electronic medical record.  These signatures attest to the fact that that the information above on your After Visit Summary has been reviewed and is understood.  Full responsibility of the confidentiality of this discharge information lies with you and/or  your care-partner.  Next colonoscopy in 10 years.

## 2014-12-23 ENCOUNTER — Telehealth: Payer: Self-pay | Admitting: *Deleted

## 2014-12-23 NOTE — Telephone Encounter (Signed)
  Follow up Call-  Call back number 12/22/2014  Post procedure Call Back phone  # [phone]  Permission to leave phone message Yes     Patient questions:  Do you have a fever, pain , or abdominal swelling? No. Pain Score  0 *  Have you tolerated food without any problems? Yes.    Have you been able to return to your normal activities? Yes.    Do you have any questions about your discharge instructions: Diet   No. Medications  No. Follow up visit  No.  Do you have questions or concerns about your Care? No.  Actions: * If pain score is 4 or above: No action needed, pain <4.

## 2015-08-11 ENCOUNTER — Other Ambulatory Visit: Payer: Self-pay

## 2015-08-11 DIAGNOSIS — Z1231 Encounter for screening mammogram for malignant neoplasm of breast: Secondary | ICD-10-CM

## 2015-08-29 ENCOUNTER — Emergency Department (HOSPITAL_COMMUNITY)
Admission: EM | Admit: 2015-08-29 | Discharge: 2015-08-29 | Disposition: A | Discharge status: Home / Self Care | Payer: 59 | Attending: Emergency Medicine | Admitting: Emergency Medicine

## 2015-08-29 ENCOUNTER — Other Ambulatory Visit: Payer: Self-pay | Admitting: Physician Assistant

## 2015-08-29 ENCOUNTER — Encounter (HOSPITAL_COMMUNITY): Payer: Self-pay | Admitting: Emergency Medicine

## 2015-08-29 ENCOUNTER — Emergency Department (HOSPITAL_COMMUNITY): Payer: 59

## 2015-08-29 DIAGNOSIS — Z7982 Long term (current) use of aspirin: Secondary | ICD-10-CM | POA: Insufficient documentation

## 2015-08-29 DIAGNOSIS — I471 Supraventricular tachycardia: Secondary | ICD-10-CM

## 2015-08-29 DIAGNOSIS — Z79899 Other long term (current) drug therapy: Secondary | ICD-10-CM | POA: Insufficient documentation

## 2015-08-29 DIAGNOSIS — M810 Age-related osteoporosis without current pathological fracture: Secondary | ICD-10-CM | POA: Diagnosis not present

## 2015-08-29 DIAGNOSIS — M1990 Unspecified osteoarthritis, unspecified site: Secondary | ICD-10-CM | POA: Insufficient documentation

## 2015-08-29 DIAGNOSIS — M8580 Other specified disorders of bone density and structure, unspecified site: Secondary | ICD-10-CM | POA: Diagnosis not present

## 2015-08-29 DIAGNOSIS — R0602 Shortness of breath: Secondary | ICD-10-CM | POA: Diagnosis not present

## 2015-08-29 DIAGNOSIS — I498 Other specified cardiac arrhythmias: Secondary | ICD-10-CM | POA: Diagnosis not present

## 2015-08-29 DIAGNOSIS — K219 Gastro-esophageal reflux disease without esophagitis: Secondary | ICD-10-CM | POA: Diagnosis not present

## 2015-08-29 HISTORY — DX: Supraventricular tachycardia: I47.1

## 2015-08-29 HISTORY — DX: Supraventricular tachycardia, unspecified: I47.10

## 2015-08-29 LAB — CBC
HEMATOCRIT: 37.5 % (ref 36.0–46.0)
HEMOGLOBIN: 12.8 g/dL (ref 12.0–15.0)
MCH: 30 pg (ref 26.0–34.0)
MCHC: 34.1 g/dL (ref 30.0–36.0)
MCV: 87.8 fL (ref 78.0–100.0)
Platelets: 214 10*3/uL (ref 150–400)
RBC: 4.27 MIL/uL (ref 3.87–5.11)
RDW: 12.8 % (ref 11.5–15.5)
WBC: 7.3 10*3/uL (ref 4.0–10.5)

## 2015-08-29 LAB — BASIC METABOLIC PANEL
ANION GAP: 11 (ref 5–15)
BUN: 21 mg/dL — ABNORMAL HIGH (ref 6–20)
CALCIUM: 10 mg/dL (ref 8.9–10.3)
CO2: 22 mmol/L (ref 22–32)
Chloride: 105 mmol/L (ref 101–111)
Creatinine, Ser: 0.82 mg/dL (ref 0.44–1.00)
Glucose, Bld: 164 mg/dL — ABNORMAL HIGH (ref 65–99)
POTASSIUM: 4 mmol/L (ref 3.5–5.1)
Sodium: 138 mmol/L (ref 135–145)

## 2015-08-29 LAB — I-STAT TROPONIN, ED: TROPONIN I, POC: 0 ng/mL (ref 0.00–0.08)

## 2015-08-29 LAB — TSH: TSH: 1.123 u[IU]/mL (ref 0.350–4.500)

## 2015-08-29 MED ORDER — METOPROLOL SUCCINATE ER 25 MG PO TB24: 12.5000 mg | ORAL_TABLET | Freq: Every day | ORAL | Status: DC

## 2015-08-29 MED FILL — METOPROLOL SUCC ER 25 MG TA: 25 | 60 days supply | Qty: 30 | Fill #0

## 2015-08-29 NOTE — ED Notes (Signed)
Patient began to feel short of breath today. Patient has a hx of SVT. Patient's heart rate currently 100.

## 2015-08-29 NOTE — ED Provider Notes (Signed)
CSN: [id number]     Arrival date & time 08/29/15  [id number] History   First MD Initiated Contact with Patient 08/29/15 0900     Chief Complaint  Patient presents with  . Shortness of Breath     (Consider location/radiation/quality/duration/timing/severity/associated sxs/prior Treatment) HPI Comments: Patient here after having episodes of SVT prior to arrival. This was self diagnosed as a patient felt her heart beating rapid. She uses vagal maneuvers which made her symptoms better. This is her third episode of having these symptoms but she has not been evaluated by her physician. Denies any associated chest pain or shortness of breath. Does drink 2-3 cups of coffee a day. No recent fever or illness. 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 It resolved and when her heart rate was checked it was in the 70s. She went to her car and had a second episode and was again used to maneuvers which resolved her symptoms. Symptoms seem to be spontaneous and nothing makes them worse.  Patient is a 65 y.o. female presenting with shortness of breath. The history is provided by the patient.  Shortness of Breath   Past Medical History  Diagnosis Date  . History of kidney stones   . Osteopenia   . Arthritis   . Hypoproteinemia ([facility])   . GERD (gastroesophageal reflux disease)   . Osteoporosis   . Renal cyst   . History of colitis 09/06/2014  . SVT (supraventricular tachycardia) [facility])    Past Surgical History  Procedure Laterality Date  . Laparoscopy Left 1989,& 90's    salpingoopherectomy cyst x2  . Colonoscopy  2006, 2016    normal   Family History  Problem Relation Age of Onset  . Atrial fibrillation Mother   . Hypertension Mother   . Arthritis Mother   . Pneumonia Mother   . Atrial fibrillation Father   . Hypertension Father   . Arthritis Father   . Heart attack Father   . Clotting disorder Father     DVT's  . Suicidality Paternal Grandmother   . Hypertension  Maternal Grandfather   . Heart disease Maternal Grandfather   . Colon cancer Neg Hx    Social History  Substance Use Topics  . Smoking status: Never Smoker   . Smokeless tobacco: Never Used  . Alcohol Use: No   OB History    No data available     Review of Systems  Respiratory: Positive for shortness of breath.   All other systems reviewed and are negative.     Allergies  Review of patient's allergies indicates no known allergies.  Home Medications   Prior to Admission medications   Medication Sig Start Date End Date Taking? Authorizing Provider  aspirin EC 81 MG tablet Take 81 mg by mouth daily.    Historical Provider, MD  bisacodyl (BISACODYL) 5 MG EC tablet Take 5 mg by mouth once. Per prep    Historical Provider, MD  Calcium Carbonate (CALCIUM 600 PO) Take by mouth.    Historical Provider, MD  Cholecalciferol (VITAMIN D-3 PO) Take 2,000 Units by mouth.    Historical Provider, MD  famotidine (PEPCID) 20 MG tablet Take 20 mg by mouth daily.    Historical Provider, MD  glucosamine-chondroitin 500-400 MG tablet Take 1 tablet by mouth 3 (three) times daily.    Historical Provider, MD  Multiple Vitamin (MULTIVITAMIN) tablet Take 1 tablet by mouth daily.    Historical Provider, MD  omega-3 acid  ethyl esters (LOVAZA) 1 G capsule Take by mouth 2 (two) times daily.    Historical Provider, MD  vitamin C (ASCORBIC ACID) 500 MG tablet Take 500 mg by mouth daily.    Historical Provider, MD   BP 154/83 mmHg  Pulse 87  Temp(Src) 97.9 F (36.6 C) (Oral)  Resp 12  SpO2 100% Physical Exam  Constitutional: She is oriented to person, place, and time. She appears well-developed and well-nourished.  Non-toxic appearance. No distress.  HENT:  Head: Normocephalic and atraumatic.  Eyes: Conjunctivae, EOM and lids are normal. Pupils are equal, round, and reactive to light.  Neck: Normal range of motion. Neck supple. No tracheal deviation present. No thyroid mass present.  Cardiovascular:  Normal rate, regular rhythm and normal heart sounds.  Exam reveals no gallop.   No murmur heard. Pulmonary/Chest: Effort normal and breath sounds normal. No stridor. No respiratory distress. She has no decreased breath sounds. She has no wheezes. She has no rhonchi. She has no rales.  Abdominal: Soft. Normal appearance and bowel sounds are normal. She exhibits no distension. There is no tenderness. There is no rebound and no CVA tenderness.  Musculoskeletal: Normal range of motion. She exhibits no edema or tenderness.  Neurological: She is alert and oriented to person, place, and time. She has normal strength. No cranial nerve deficit or sensory deficit. GCS eye subscore is 4. GCS verbal subscore is 5. GCS motor subscore is 6.  Skin: Skin is warm and dry. No abrasion and no rash noted.  Psychiatric: She has a normal mood and affect. Her speech is normal and behavior is normal.  Nursing note and vitals reviewed.   ED Course  Procedures (including critical care time) Labs Review Labs Reviewed  CBC  BASIC METABOLIC PANEL  TSH  I-STAT TROPOININ, ED    Imaging Review No results found. I have personally reviewed and evaluated these images and lab results as part of my medical decision-making.   EKG Interpretation   Date/Time:  Tuesday Aug 29 2015 08:39:07 EDT Ventricular Rate:  94 PR Interval:  148 QRS Duration: 99 QT Interval:  373 QTC Calculation: 466 R Axis:   89 Text Interpretation:  Sinus rhythm Borderline right axis deviation Minimal  ST depression, diffuse leads Confirmed by Akeyla Molden  MD, Nashya Garlington ([id number]) on  08/29/2015 9:01:15 AM      MDM   Final diagnoses:  None    Patient monitored here and no signs of cardiac arrhythmia noted. Her TSH is within normal limits. Hemoglobin stable. Discussed case with Dr. Burt Knack from cardiology and patient will be started on Toprol XL at bedtime and he will have his office call her to schedule a 21 day event monitor    Lacretia Leigh,  MD 08/29/15 1337

## 2015-08-29 NOTE — Discharge Instructions (Signed)
Paroxysmal Supraventricular Tachycardia Paroxysmal supraventricular tachycardia (PSVT) is a type of abnormal heart rhythm. It causes your heart to beat very quickly and then suddenly stop beating so quickly. A normal heart rate is 60-100 beats per minute. During an episode of PSVT, your heart rate may be 150-250 beats per minute. This can make you feel light-headed and short of breath. An episode of PSVT can be frightening. It is usually not dangerous. The heart has four chambers. All chambers need to work together for the heart to beat effectively. A normal heartbeat usually starts in the right upper chamber of the heart (atrium) when an area (sinoatrial node) puts out an electrical signal that spreads to the other chambers. People with PSVT may have abnormal electrical pathways, or they may have other areas in the upper chambers that send out electrical signals. The result is a very rapid heartbeat. When your heart beats very quickly, it does not have time to fill completely with blood. When PSVT happens often or it lasts for long periods, it can lead to heart weakness and failure. Most people with PSVT do not have any other heart disease. CAUSES Abnormal electrical activity in the heart causes PSVT. It is not known why some people get PSVT and others do not. RISK FACTORS You may be more likely to have PSVT if:  You are 20-30 years old.  You are a woman. Other factors that may increase your chances of an attack include:  Stress.  Being tired.  Smoking.  Stimulant drugs.  Alcoholic drinks.  Caffeine.  Pregnancy. SIGNS AND SYMPTOMS A mild episode of PSVT may cause no symptoms. If you do have signs and symptoms, they may include:  A pounding heart.  Feeling of skipped heartbeats (palpitations).  Weakness.  Shortness of breath.  Tightness or pain in your chest.  Light-headedness.  Anxiety.  Dizziness.  Sweating.  Nausea.  A fainting spell. DIAGNOSIS Your health care  provider may suspect PSVT if you have symptoms that come and go. The health care provider will do a physical exam. If you are having an episode during the exam, the health care provider may be able to diagnose PSVT by listening to your heart and feeling your pulse. Tests may also be done, including:  An electrical study of your heart (electrocardiogram, or ECG).  A test in which you wear a portable ECG monitor all day (Holter monitor) or for several days (event monitor).  A test that involves taking an image of your heart using sound waves (echocardiogram) to rule out other causes of a fast heart rate. TREATMENT You may not need treatment if episodes of PSVT do not happen often or if they do not cause symptoms. If PSVT episodes do cause symptoms, your health care provider may first suggest trying a self-treatment called vagus nerve stimulation. The vagus nerve extends down from the brain. It regulates certain body functions. Stimulating this nerve can slow down the heart. Your health care provider can teach you ways to do this. You may need to try a few ways to find what works best for you. Options include:  Holding your breath and pushing, as though you are having a bowel movement.  Massaging an area on one side of your neck below your jaw.  Bending forward with your head between your legs.  Bending forward with your head between your legs and coughing.  Massaging your eyeballs with your eyes closed. If vagus nerve stimulation does not work, other treatment options include:    Medicines to prevent an attack.  Being treated in the hospital with medicine or electric shock to stop an attack (cardioversion). This treatment can include:  Getting medicine through an IV line.  Having a small electric shock delivered to your heart. You will be given medicine to make you sleep through this procedure.  If you have frequent episodes with symptoms, you may need a procedure to get rid of the faulty  areas of your heart (radiofrequency ablation) and end the episodes of PSVT. In this procedure:  A long, thin tube (catheter) is passed through one of your veins into your heart.  Energy directed through the catheter eliminates the areas of your heart that are causing abnormal electric stimulation. HOME CARE INSTRUCTIONS  Take medicines only as directed by your health care provider.  Do not use caffeine in any form if caffeine triggers episodes of PSVT. Otherwise, consume caffeine in moderation. This means no more than a few cups of coffee or the equivalent each day.  Do not drink alcohol if alcohol triggers episodes of PSVT. Otherwise, limit alcohol intake to no more than 1 drink per day for nonpregnant women and 2 drinks per day for men. One drink equals 12 ounces of beer, 5 ounces of wine, or 1 ounces of hard liquor.  Do not use any tobacco products, including cigarettes, chewing tobacco, or electronic cigarettes. If you need help quitting, ask your health care provider.  Try to get at least 7 hours of sleep each night.  Find healthy ways to manage stress.  Perform vagus nerve stimulation as directed by your health care provider.  Maintain a healthy weight.  Get some exercise on most days. Ask your health care provider to suggest some good activities for you. SEEK MEDICAL CARE IF:  You are having episodes of PSVT more often, or they are lasting longer.  Vagus nerve stimulation is no longer helping.  You have new symptoms during an episode. SEEK IMMEDIATE MEDICAL CARE IF:  You have chest pain or trouble breathing.  You have an episode of PSVT that has lasted longer than 20 minutes.  You have passed out from an episode of PSVT. These symptoms may represent a serious problem that is an emergency. Do not wait to see if the symptoms will go away. Get medical help right away. Call your local emergency services (911 in the [place] Do not drive yourself to the hospital.   This  information is not intended to replace advice given to you by your health care provider. Make sure you discuss any questions you have with your health care provider.   Document Released: 04/15/2005 Document Revised: 05/06/2014 Document Reviewed: 09/23/2013 Elsevier Interactive Patient Education 2016 [employer]  

## 2015-08-30 ENCOUNTER — Ambulatory Visit
Admission: RE | Admit: 2015-08-30 | Discharge: 2015-08-30 | Disposition: A | Discharge status: Home / Self Care | Payer: 59 | Source: Ambulatory Visit

## 2015-08-30 DIAGNOSIS — Z1231 Encounter for screening mammogram for malignant neoplasm of breast: Secondary | ICD-10-CM | POA: Diagnosis not present

## 2015-09-05 ENCOUNTER — Ambulatory Visit (INDEPENDENT_AMBULATORY_CARE_PROVIDER_SITE_OTHER): Payer: 59

## 2015-09-05 DIAGNOSIS — I471 Supraventricular tachycardia: Secondary | ICD-10-CM

## 2015-09-15 DIAGNOSIS — D1801 Hemangioma of skin and subcutaneous tissue: Secondary | ICD-10-CM | POA: Diagnosis not present

## 2015-09-15 DIAGNOSIS — L821 Other seborrheic keratosis: Secondary | ICD-10-CM | POA: Diagnosis not present

## 2015-09-15 DIAGNOSIS — D225 Melanocytic nevi of trunk: Secondary | ICD-10-CM | POA: Diagnosis not present

## 2015-09-15 DIAGNOSIS — D2261 Melanocytic nevi of right upper limb, including shoulder: Secondary | ICD-10-CM | POA: Diagnosis not present

## 2015-09-15 DIAGNOSIS — D2271 Melanocytic nevi of right lower limb, including hip: Secondary | ICD-10-CM | POA: Diagnosis not present

## 2015-09-15 DIAGNOSIS — D2272 Melanocytic nevi of left lower limb, including hip: Secondary | ICD-10-CM | POA: Diagnosis not present

## 2015-09-15 DIAGNOSIS — L814 Other melanin hyperpigmentation: Secondary | ICD-10-CM | POA: Diagnosis not present

## 2015-09-15 DIAGNOSIS — D2372 Other benign neoplasm of skin of left lower limb, including hip: Secondary | ICD-10-CM | POA: Diagnosis not present

## 2015-10-12 ENCOUNTER — Encounter: Payer: Self-pay | Admitting: Cardiovascular Disease

## 2015-10-12 NOTE — Telephone Encounter (Signed)
This encounter was created in error - please disregard.

## 2015-10-12 NOTE — Telephone Encounter (Signed)
New message    Patient calling back for monitor results

## 2015-11-13 DIAGNOSIS — N281 Cyst of kidney, acquired: Secondary | ICD-10-CM | POA: Diagnosis not present

## 2015-11-14 DIAGNOSIS — H5213 Myopia, bilateral: Secondary | ICD-10-CM | POA: Diagnosis not present

## 2015-12-19 DIAGNOSIS — Z23 Encounter for immunization: Secondary | ICD-10-CM | POA: Diagnosis not present

## 2015-12-19 DIAGNOSIS — M859 Disorder of bone density and structure, unspecified: Secondary | ICD-10-CM | POA: Diagnosis not present

## 2015-12-19 DIAGNOSIS — M25561 Pain in right knee: Secondary | ICD-10-CM | POA: Diagnosis not present

## 2015-12-19 DIAGNOSIS — Z0000 Encounter for general adult medical examination without abnormal findings: Secondary | ICD-10-CM | POA: Diagnosis not present

## 2015-12-19 DIAGNOSIS — M25562 Pain in left knee: Secondary | ICD-10-CM | POA: Diagnosis not present

## 2016-02-12 DIAGNOSIS — M5412 Radiculopathy, cervical region: Secondary | ICD-10-CM | POA: Diagnosis not present

## 2016-02-12 MED FILL — predniSONE 20 MG TABS: 20 | 5 days supply | Qty: 10 | Fill #0

## 2016-02-20 DIAGNOSIS — M9903 Segmental and somatic dysfunction of lumbar region: Secondary | ICD-10-CM | POA: Diagnosis not present

## 2016-02-20 DIAGNOSIS — M50122 Cervical disc disorder at C5-C6 level with radiculopathy: Secondary | ICD-10-CM | POA: Diagnosis not present

## 2016-02-20 DIAGNOSIS — M9904 Segmental and somatic dysfunction of sacral region: Secondary | ICD-10-CM | POA: Diagnosis not present

## 2016-02-20 DIAGNOSIS — M9902 Segmental and somatic dysfunction of thoracic region: Secondary | ICD-10-CM | POA: Diagnosis not present

## 2016-02-20 DIAGNOSIS — M9905 Segmental and somatic dysfunction of pelvic region: Secondary | ICD-10-CM | POA: Diagnosis not present

## 2016-02-20 DIAGNOSIS — Q72812 Congenital shortening of left lower limb: Secondary | ICD-10-CM | POA: Diagnosis not present

## 2016-02-20 DIAGNOSIS — M5136 Other intervertebral disc degeneration, lumbar region: Secondary | ICD-10-CM | POA: Diagnosis not present

## 2016-02-20 DIAGNOSIS — M9901 Segmental and somatic dysfunction of cervical region: Secondary | ICD-10-CM | POA: Diagnosis not present

## 2016-02-20 DIAGNOSIS — M461 Sacroiliitis, not elsewhere classified: Secondary | ICD-10-CM | POA: Diagnosis not present

## 2016-02-22 DIAGNOSIS — M9901 Segmental and somatic dysfunction of cervical region: Secondary | ICD-10-CM | POA: Diagnosis not present

## 2016-02-22 DIAGNOSIS — M5136 Other intervertebral disc degeneration, lumbar region: Secondary | ICD-10-CM | POA: Diagnosis not present

## 2016-02-22 DIAGNOSIS — M9904 Segmental and somatic dysfunction of sacral region: Secondary | ICD-10-CM | POA: Diagnosis not present

## 2016-02-22 DIAGNOSIS — M9903 Segmental and somatic dysfunction of lumbar region: Secondary | ICD-10-CM | POA: Diagnosis not present

## 2016-02-22 DIAGNOSIS — M9905 Segmental and somatic dysfunction of pelvic region: Secondary | ICD-10-CM | POA: Diagnosis not present

## 2016-02-22 DIAGNOSIS — Q72812 Congenital shortening of left lower limb: Secondary | ICD-10-CM | POA: Diagnosis not present

## 2016-02-22 DIAGNOSIS — M50122 Cervical disc disorder at C5-C6 level with radiculopathy: Secondary | ICD-10-CM | POA: Diagnosis not present

## 2016-02-22 DIAGNOSIS — M461 Sacroiliitis, not elsewhere classified: Secondary | ICD-10-CM | POA: Diagnosis not present

## 2016-02-22 DIAGNOSIS — M9902 Segmental and somatic dysfunction of thoracic region: Secondary | ICD-10-CM | POA: Diagnosis not present

## 2016-02-28 DIAGNOSIS — Q72812 Congenital shortening of left lower limb: Secondary | ICD-10-CM | POA: Diagnosis not present

## 2016-02-28 DIAGNOSIS — M50122 Cervical disc disorder at C5-C6 level with radiculopathy: Secondary | ICD-10-CM | POA: Diagnosis not present

## 2016-02-28 DIAGNOSIS — M461 Sacroiliitis, not elsewhere classified: Secondary | ICD-10-CM | POA: Diagnosis not present

## 2016-02-28 DIAGNOSIS — M9903 Segmental and somatic dysfunction of lumbar region: Secondary | ICD-10-CM | POA: Diagnosis not present

## 2016-02-28 DIAGNOSIS — M9904 Segmental and somatic dysfunction of sacral region: Secondary | ICD-10-CM | POA: Diagnosis not present

## 2016-02-28 DIAGNOSIS — M9902 Segmental and somatic dysfunction of thoracic region: Secondary | ICD-10-CM | POA: Diagnosis not present

## 2016-02-28 DIAGNOSIS — M5136 Other intervertebral disc degeneration, lumbar region: Secondary | ICD-10-CM | POA: Diagnosis not present

## 2016-02-28 DIAGNOSIS — M9905 Segmental and somatic dysfunction of pelvic region: Secondary | ICD-10-CM | POA: Diagnosis not present

## 2016-02-28 DIAGNOSIS — M9901 Segmental and somatic dysfunction of cervical region: Secondary | ICD-10-CM | POA: Diagnosis not present

## 2016-02-29 DIAGNOSIS — M9903 Segmental and somatic dysfunction of lumbar region: Secondary | ICD-10-CM | POA: Diagnosis not present

## 2016-02-29 DIAGNOSIS — M9901 Segmental and somatic dysfunction of cervical region: Secondary | ICD-10-CM | POA: Diagnosis not present

## 2016-02-29 DIAGNOSIS — M5136 Other intervertebral disc degeneration, lumbar region: Secondary | ICD-10-CM | POA: Diagnosis not present

## 2016-02-29 DIAGNOSIS — M50122 Cervical disc disorder at C5-C6 level with radiculopathy: Secondary | ICD-10-CM | POA: Diagnosis not present

## 2016-02-29 DIAGNOSIS — M9904 Segmental and somatic dysfunction of sacral region: Secondary | ICD-10-CM | POA: Diagnosis not present

## 2016-02-29 DIAGNOSIS — M461 Sacroiliitis, not elsewhere classified: Secondary | ICD-10-CM | POA: Diagnosis not present

## 2016-02-29 DIAGNOSIS — M9905 Segmental and somatic dysfunction of pelvic region: Secondary | ICD-10-CM | POA: Diagnosis not present

## 2016-02-29 DIAGNOSIS — M9902 Segmental and somatic dysfunction of thoracic region: Secondary | ICD-10-CM | POA: Diagnosis not present

## 2016-02-29 DIAGNOSIS — Q72812 Congenital shortening of left lower limb: Secondary | ICD-10-CM | POA: Diagnosis not present

## 2016-03-05 DIAGNOSIS — M461 Sacroiliitis, not elsewhere classified: Secondary | ICD-10-CM | POA: Diagnosis not present

## 2016-03-05 DIAGNOSIS — M9903 Segmental and somatic dysfunction of lumbar region: Secondary | ICD-10-CM | POA: Diagnosis not present

## 2016-03-05 DIAGNOSIS — M50122 Cervical disc disorder at C5-C6 level with radiculopathy: Secondary | ICD-10-CM | POA: Diagnosis not present

## 2016-03-05 DIAGNOSIS — M9902 Segmental and somatic dysfunction of thoracic region: Secondary | ICD-10-CM | POA: Diagnosis not present

## 2016-03-05 DIAGNOSIS — M9901 Segmental and somatic dysfunction of cervical region: Secondary | ICD-10-CM | POA: Diagnosis not present

## 2016-03-05 DIAGNOSIS — M5136 Other intervertebral disc degeneration, lumbar region: Secondary | ICD-10-CM | POA: Diagnosis not present

## 2016-03-05 DIAGNOSIS — M9904 Segmental and somatic dysfunction of sacral region: Secondary | ICD-10-CM | POA: Diagnosis not present

## 2016-03-05 DIAGNOSIS — Q72812 Congenital shortening of left lower limb: Secondary | ICD-10-CM | POA: Diagnosis not present

## 2016-03-05 DIAGNOSIS — M9905 Segmental and somatic dysfunction of pelvic region: Secondary | ICD-10-CM | POA: Diagnosis not present

## 2016-03-06 DIAGNOSIS — M47819 Spondylosis without myelopathy or radiculopathy, site unspecified: Secondary | ICD-10-CM | POA: Diagnosis not present

## 2016-03-06 DIAGNOSIS — M5032 Other cervical disc degeneration, mid-cervical region, unspecified level: Secondary | ICD-10-CM | POA: Diagnosis not present

## 2016-03-07 DIAGNOSIS — M461 Sacroiliitis, not elsewhere classified: Secondary | ICD-10-CM | POA: Diagnosis not present

## 2016-03-07 DIAGNOSIS — M9902 Segmental and somatic dysfunction of thoracic region: Secondary | ICD-10-CM | POA: Diagnosis not present

## 2016-03-07 DIAGNOSIS — Q72812 Congenital shortening of left lower limb: Secondary | ICD-10-CM | POA: Diagnosis not present

## 2016-03-07 DIAGNOSIS — M9904 Segmental and somatic dysfunction of sacral region: Secondary | ICD-10-CM | POA: Diagnosis not present

## 2016-03-07 DIAGNOSIS — M5136 Other intervertebral disc degeneration, lumbar region: Secondary | ICD-10-CM | POA: Diagnosis not present

## 2016-03-07 DIAGNOSIS — M9901 Segmental and somatic dysfunction of cervical region: Secondary | ICD-10-CM | POA: Diagnosis not present

## 2016-03-07 DIAGNOSIS — M9903 Segmental and somatic dysfunction of lumbar region: Secondary | ICD-10-CM | POA: Diagnosis not present

## 2016-03-07 DIAGNOSIS — M50122 Cervical disc disorder at C5-C6 level with radiculopathy: Secondary | ICD-10-CM | POA: Diagnosis not present

## 2016-03-07 DIAGNOSIS — M9905 Segmental and somatic dysfunction of pelvic region: Secondary | ICD-10-CM | POA: Diagnosis not present

## 2016-03-08 DIAGNOSIS — M9904 Segmental and somatic dysfunction of sacral region: Secondary | ICD-10-CM | POA: Diagnosis not present

## 2016-03-08 DIAGNOSIS — Q72812 Congenital shortening of left lower limb: Secondary | ICD-10-CM | POA: Diagnosis not present

## 2016-03-08 DIAGNOSIS — M461 Sacroiliitis, not elsewhere classified: Secondary | ICD-10-CM | POA: Diagnosis not present

## 2016-03-08 DIAGNOSIS — M5136 Other intervertebral disc degeneration, lumbar region: Secondary | ICD-10-CM | POA: Diagnosis not present

## 2016-03-08 DIAGNOSIS — M9901 Segmental and somatic dysfunction of cervical region: Secondary | ICD-10-CM | POA: Diagnosis not present

## 2016-03-08 DIAGNOSIS — M9902 Segmental and somatic dysfunction of thoracic region: Secondary | ICD-10-CM | POA: Diagnosis not present

## 2016-03-08 DIAGNOSIS — M9903 Segmental and somatic dysfunction of lumbar region: Secondary | ICD-10-CM | POA: Diagnosis not present

## 2016-03-08 DIAGNOSIS — M50122 Cervical disc disorder at C5-C6 level with radiculopathy: Secondary | ICD-10-CM | POA: Diagnosis not present

## 2016-03-08 DIAGNOSIS — M9905 Segmental and somatic dysfunction of pelvic region: Secondary | ICD-10-CM | POA: Diagnosis not present

## 2016-03-11 DIAGNOSIS — M9903 Segmental and somatic dysfunction of lumbar region: Secondary | ICD-10-CM | POA: Diagnosis not present

## 2016-03-11 DIAGNOSIS — M5136 Other intervertebral disc degeneration, lumbar region: Secondary | ICD-10-CM | POA: Diagnosis not present

## 2016-03-11 DIAGNOSIS — M461 Sacroiliitis, not elsewhere classified: Secondary | ICD-10-CM | POA: Diagnosis not present

## 2016-03-11 DIAGNOSIS — M9901 Segmental and somatic dysfunction of cervical region: Secondary | ICD-10-CM | POA: Diagnosis not present

## 2016-03-11 DIAGNOSIS — M9905 Segmental and somatic dysfunction of pelvic region: Secondary | ICD-10-CM | POA: Diagnosis not present

## 2016-03-11 DIAGNOSIS — Q72812 Congenital shortening of left lower limb: Secondary | ICD-10-CM | POA: Diagnosis not present

## 2016-03-11 DIAGNOSIS — M9904 Segmental and somatic dysfunction of sacral region: Secondary | ICD-10-CM | POA: Diagnosis not present

## 2016-03-11 DIAGNOSIS — M9902 Segmental and somatic dysfunction of thoracic region: Secondary | ICD-10-CM | POA: Diagnosis not present

## 2016-03-11 DIAGNOSIS — M50122 Cervical disc disorder at C5-C6 level with radiculopathy: Secondary | ICD-10-CM | POA: Diagnosis not present

## 2016-03-12 DIAGNOSIS — M9902 Segmental and somatic dysfunction of thoracic region: Secondary | ICD-10-CM | POA: Diagnosis not present

## 2016-03-12 DIAGNOSIS — M50122 Cervical disc disorder at C5-C6 level with radiculopathy: Secondary | ICD-10-CM | POA: Diagnosis not present

## 2016-03-12 DIAGNOSIS — M9901 Segmental and somatic dysfunction of cervical region: Secondary | ICD-10-CM | POA: Diagnosis not present

## 2016-03-12 DIAGNOSIS — Q72812 Congenital shortening of left lower limb: Secondary | ICD-10-CM | POA: Diagnosis not present

## 2016-03-12 DIAGNOSIS — M9905 Segmental and somatic dysfunction of pelvic region: Secondary | ICD-10-CM | POA: Diagnosis not present

## 2016-03-12 DIAGNOSIS — M9903 Segmental and somatic dysfunction of lumbar region: Secondary | ICD-10-CM | POA: Diagnosis not present

## 2016-03-12 DIAGNOSIS — M5136 Other intervertebral disc degeneration, lumbar region: Secondary | ICD-10-CM | POA: Diagnosis not present

## 2016-03-12 DIAGNOSIS — M461 Sacroiliitis, not elsewhere classified: Secondary | ICD-10-CM | POA: Diagnosis not present

## 2016-03-12 DIAGNOSIS — M9904 Segmental and somatic dysfunction of sacral region: Secondary | ICD-10-CM | POA: Diagnosis not present

## 2016-03-14 DIAGNOSIS — M461 Sacroiliitis, not elsewhere classified: Secondary | ICD-10-CM | POA: Diagnosis not present

## 2016-03-14 DIAGNOSIS — M9903 Segmental and somatic dysfunction of lumbar region: Secondary | ICD-10-CM | POA: Diagnosis not present

## 2016-03-14 DIAGNOSIS — M9902 Segmental and somatic dysfunction of thoracic region: Secondary | ICD-10-CM | POA: Diagnosis not present

## 2016-03-14 DIAGNOSIS — Q72812 Congenital shortening of left lower limb: Secondary | ICD-10-CM | POA: Diagnosis not present

## 2016-03-14 DIAGNOSIS — M50122 Cervical disc disorder at C5-C6 level with radiculopathy: Secondary | ICD-10-CM | POA: Diagnosis not present

## 2016-03-14 DIAGNOSIS — M5136 Other intervertebral disc degeneration, lumbar region: Secondary | ICD-10-CM | POA: Diagnosis not present

## 2016-03-14 DIAGNOSIS — M9901 Segmental and somatic dysfunction of cervical region: Secondary | ICD-10-CM | POA: Diagnosis not present

## 2016-03-14 DIAGNOSIS — M9904 Segmental and somatic dysfunction of sacral region: Secondary | ICD-10-CM | POA: Diagnosis not present

## 2016-03-14 DIAGNOSIS — M9905 Segmental and somatic dysfunction of pelvic region: Secondary | ICD-10-CM | POA: Diagnosis not present

## 2016-03-18 DIAGNOSIS — M50122 Cervical disc disorder at C5-C6 level with radiculopathy: Secondary | ICD-10-CM | POA: Diagnosis not present

## 2016-03-18 DIAGNOSIS — M9905 Segmental and somatic dysfunction of pelvic region: Secondary | ICD-10-CM | POA: Diagnosis not present

## 2016-03-18 DIAGNOSIS — M9902 Segmental and somatic dysfunction of thoracic region: Secondary | ICD-10-CM | POA: Diagnosis not present

## 2016-03-18 DIAGNOSIS — M461 Sacroiliitis, not elsewhere classified: Secondary | ICD-10-CM | POA: Diagnosis not present

## 2016-03-18 DIAGNOSIS — Q72812 Congenital shortening of left lower limb: Secondary | ICD-10-CM | POA: Diagnosis not present

## 2016-03-18 DIAGNOSIS — M9903 Segmental and somatic dysfunction of lumbar region: Secondary | ICD-10-CM | POA: Diagnosis not present

## 2016-03-18 DIAGNOSIS — M9901 Segmental and somatic dysfunction of cervical region: Secondary | ICD-10-CM | POA: Diagnosis not present

## 2016-03-18 DIAGNOSIS — M9904 Segmental and somatic dysfunction of sacral region: Secondary | ICD-10-CM | POA: Diagnosis not present

## 2016-03-18 DIAGNOSIS — M5136 Other intervertebral disc degeneration, lumbar region: Secondary | ICD-10-CM | POA: Diagnosis not present

## 2016-03-19 DIAGNOSIS — M9905 Segmental and somatic dysfunction of pelvic region: Secondary | ICD-10-CM | POA: Diagnosis not present

## 2016-03-19 DIAGNOSIS — M461 Sacroiliitis, not elsewhere classified: Secondary | ICD-10-CM | POA: Diagnosis not present

## 2016-03-19 DIAGNOSIS — M9902 Segmental and somatic dysfunction of thoracic region: Secondary | ICD-10-CM | POA: Diagnosis not present

## 2016-03-19 DIAGNOSIS — M5136 Other intervertebral disc degeneration, lumbar region: Secondary | ICD-10-CM | POA: Diagnosis not present

## 2016-03-19 DIAGNOSIS — M50122 Cervical disc disorder at C5-C6 level with radiculopathy: Secondary | ICD-10-CM | POA: Diagnosis not present

## 2016-03-19 DIAGNOSIS — M9904 Segmental and somatic dysfunction of sacral region: Secondary | ICD-10-CM | POA: Diagnosis not present

## 2016-03-19 DIAGNOSIS — M9903 Segmental and somatic dysfunction of lumbar region: Secondary | ICD-10-CM | POA: Diagnosis not present

## 2016-03-19 DIAGNOSIS — M9901 Segmental and somatic dysfunction of cervical region: Secondary | ICD-10-CM | POA: Diagnosis not present

## 2016-03-19 DIAGNOSIS — Q72812 Congenital shortening of left lower limb: Secondary | ICD-10-CM | POA: Diagnosis not present

## 2016-03-25 DIAGNOSIS — M50122 Cervical disc disorder at C5-C6 level with radiculopathy: Secondary | ICD-10-CM | POA: Diagnosis not present

## 2016-03-25 DIAGNOSIS — M9903 Segmental and somatic dysfunction of lumbar region: Secondary | ICD-10-CM | POA: Diagnosis not present

## 2016-03-25 DIAGNOSIS — M9904 Segmental and somatic dysfunction of sacral region: Secondary | ICD-10-CM | POA: Diagnosis not present

## 2016-03-25 DIAGNOSIS — Q72812 Congenital shortening of left lower limb: Secondary | ICD-10-CM | POA: Diagnosis not present

## 2016-03-25 DIAGNOSIS — M5136 Other intervertebral disc degeneration, lumbar region: Secondary | ICD-10-CM | POA: Diagnosis not present

## 2016-03-25 DIAGNOSIS — M9902 Segmental and somatic dysfunction of thoracic region: Secondary | ICD-10-CM | POA: Diagnosis not present

## 2016-03-25 DIAGNOSIS — M461 Sacroiliitis, not elsewhere classified: Secondary | ICD-10-CM | POA: Diagnosis not present

## 2016-03-25 DIAGNOSIS — M9905 Segmental and somatic dysfunction of pelvic region: Secondary | ICD-10-CM | POA: Diagnosis not present

## 2016-03-25 DIAGNOSIS — M9901 Segmental and somatic dysfunction of cervical region: Secondary | ICD-10-CM | POA: Diagnosis not present

## 2016-03-27 DIAGNOSIS — M461 Sacroiliitis, not elsewhere classified: Secondary | ICD-10-CM | POA: Diagnosis not present

## 2016-03-27 DIAGNOSIS — M9905 Segmental and somatic dysfunction of pelvic region: Secondary | ICD-10-CM | POA: Diagnosis not present

## 2016-03-27 DIAGNOSIS — M5136 Other intervertebral disc degeneration, lumbar region: Secondary | ICD-10-CM | POA: Diagnosis not present

## 2016-03-27 DIAGNOSIS — Q72812 Congenital shortening of left lower limb: Secondary | ICD-10-CM | POA: Diagnosis not present

## 2016-03-27 DIAGNOSIS — M9901 Segmental and somatic dysfunction of cervical region: Secondary | ICD-10-CM | POA: Diagnosis not present

## 2016-03-27 DIAGNOSIS — M9903 Segmental and somatic dysfunction of lumbar region: Secondary | ICD-10-CM | POA: Diagnosis not present

## 2016-03-27 DIAGNOSIS — M9902 Segmental and somatic dysfunction of thoracic region: Secondary | ICD-10-CM | POA: Diagnosis not present

## 2016-03-27 DIAGNOSIS — M50122 Cervical disc disorder at C5-C6 level with radiculopathy: Secondary | ICD-10-CM | POA: Diagnosis not present

## 2016-03-27 DIAGNOSIS — M9904 Segmental and somatic dysfunction of sacral region: Secondary | ICD-10-CM | POA: Diagnosis not present

## 2016-03-28 DIAGNOSIS — M9905 Segmental and somatic dysfunction of pelvic region: Secondary | ICD-10-CM | POA: Diagnosis not present

## 2016-03-28 DIAGNOSIS — M5136 Other intervertebral disc degeneration, lumbar region: Secondary | ICD-10-CM | POA: Diagnosis not present

## 2016-03-28 DIAGNOSIS — Q72812 Congenital shortening of left lower limb: Secondary | ICD-10-CM | POA: Diagnosis not present

## 2016-03-28 DIAGNOSIS — M9902 Segmental and somatic dysfunction of thoracic region: Secondary | ICD-10-CM | POA: Diagnosis not present

## 2016-03-28 DIAGNOSIS — M9903 Segmental and somatic dysfunction of lumbar region: Secondary | ICD-10-CM | POA: Diagnosis not present

## 2016-03-28 DIAGNOSIS — M9904 Segmental and somatic dysfunction of sacral region: Secondary | ICD-10-CM | POA: Diagnosis not present

## 2016-03-28 DIAGNOSIS — M461 Sacroiliitis, not elsewhere classified: Secondary | ICD-10-CM | POA: Diagnosis not present

## 2016-03-28 DIAGNOSIS — M50122 Cervical disc disorder at C5-C6 level with radiculopathy: Secondary | ICD-10-CM | POA: Diagnosis not present

## 2016-03-28 DIAGNOSIS — M9901 Segmental and somatic dysfunction of cervical region: Secondary | ICD-10-CM | POA: Diagnosis not present

## 2016-04-03 DIAGNOSIS — M9905 Segmental and somatic dysfunction of pelvic region: Secondary | ICD-10-CM | POA: Diagnosis not present

## 2016-04-03 DIAGNOSIS — M9903 Segmental and somatic dysfunction of lumbar region: Secondary | ICD-10-CM | POA: Diagnosis not present

## 2016-04-03 DIAGNOSIS — Q72812 Congenital shortening of left lower limb: Secondary | ICD-10-CM | POA: Diagnosis not present

## 2016-04-03 DIAGNOSIS — M9902 Segmental and somatic dysfunction of thoracic region: Secondary | ICD-10-CM | POA: Diagnosis not present

## 2016-04-03 DIAGNOSIS — M9901 Segmental and somatic dysfunction of cervical region: Secondary | ICD-10-CM | POA: Diagnosis not present

## 2016-04-03 DIAGNOSIS — M461 Sacroiliitis, not elsewhere classified: Secondary | ICD-10-CM | POA: Diagnosis not present

## 2016-04-03 DIAGNOSIS — M50122 Cervical disc disorder at C5-C6 level with radiculopathy: Secondary | ICD-10-CM | POA: Diagnosis not present

## 2016-04-03 DIAGNOSIS — M5136 Other intervertebral disc degeneration, lumbar region: Secondary | ICD-10-CM | POA: Diagnosis not present

## 2016-04-03 DIAGNOSIS — M9904 Segmental and somatic dysfunction of sacral region: Secondary | ICD-10-CM | POA: Diagnosis not present

## 2016-04-04 DIAGNOSIS — M9904 Segmental and somatic dysfunction of sacral region: Secondary | ICD-10-CM | POA: Diagnosis not present

## 2016-04-04 DIAGNOSIS — M5136 Other intervertebral disc degeneration, lumbar region: Secondary | ICD-10-CM | POA: Diagnosis not present

## 2016-04-04 DIAGNOSIS — M9902 Segmental and somatic dysfunction of thoracic region: Secondary | ICD-10-CM | POA: Diagnosis not present

## 2016-04-04 DIAGNOSIS — M9903 Segmental and somatic dysfunction of lumbar region: Secondary | ICD-10-CM | POA: Diagnosis not present

## 2016-04-04 DIAGNOSIS — Q72812 Congenital shortening of left lower limb: Secondary | ICD-10-CM | POA: Diagnosis not present

## 2016-04-04 DIAGNOSIS — M50122 Cervical disc disorder at C5-C6 level with radiculopathy: Secondary | ICD-10-CM | POA: Diagnosis not present

## 2016-04-04 DIAGNOSIS — M9905 Segmental and somatic dysfunction of pelvic region: Secondary | ICD-10-CM | POA: Diagnosis not present

## 2016-04-04 DIAGNOSIS — M461 Sacroiliitis, not elsewhere classified: Secondary | ICD-10-CM | POA: Diagnosis not present

## 2016-04-04 DIAGNOSIS — M9901 Segmental and somatic dysfunction of cervical region: Secondary | ICD-10-CM | POA: Diagnosis not present

## 2016-04-09 DIAGNOSIS — M9902 Segmental and somatic dysfunction of thoracic region: Secondary | ICD-10-CM | POA: Diagnosis not present

## 2016-04-09 DIAGNOSIS — M9903 Segmental and somatic dysfunction of lumbar region: Secondary | ICD-10-CM | POA: Diagnosis not present

## 2016-04-09 DIAGNOSIS — Q72812 Congenital shortening of left lower limb: Secondary | ICD-10-CM | POA: Diagnosis not present

## 2016-04-09 DIAGNOSIS — M9904 Segmental and somatic dysfunction of sacral region: Secondary | ICD-10-CM | POA: Diagnosis not present

## 2016-04-09 DIAGNOSIS — M5136 Other intervertebral disc degeneration, lumbar region: Secondary | ICD-10-CM | POA: Diagnosis not present

## 2016-04-09 DIAGNOSIS — M50122 Cervical disc disorder at C5-C6 level with radiculopathy: Secondary | ICD-10-CM | POA: Diagnosis not present

## 2016-04-09 DIAGNOSIS — M461 Sacroiliitis, not elsewhere classified: Secondary | ICD-10-CM | POA: Diagnosis not present

## 2016-04-09 DIAGNOSIS — M9901 Segmental and somatic dysfunction of cervical region: Secondary | ICD-10-CM | POA: Diagnosis not present

## 2016-04-09 DIAGNOSIS — M9905 Segmental and somatic dysfunction of pelvic region: Secondary | ICD-10-CM | POA: Diagnosis not present

## 2016-04-17 DIAGNOSIS — M9903 Segmental and somatic dysfunction of lumbar region: Secondary | ICD-10-CM | POA: Diagnosis not present

## 2016-04-17 DIAGNOSIS — M9905 Segmental and somatic dysfunction of pelvic region: Secondary | ICD-10-CM | POA: Diagnosis not present

## 2016-04-17 DIAGNOSIS — M5136 Other intervertebral disc degeneration, lumbar region: Secondary | ICD-10-CM | POA: Diagnosis not present

## 2016-04-17 DIAGNOSIS — M9904 Segmental and somatic dysfunction of sacral region: Secondary | ICD-10-CM | POA: Diagnosis not present

## 2016-04-17 DIAGNOSIS — M9902 Segmental and somatic dysfunction of thoracic region: Secondary | ICD-10-CM | POA: Diagnosis not present

## 2016-04-17 DIAGNOSIS — M9901 Segmental and somatic dysfunction of cervical region: Secondary | ICD-10-CM | POA: Diagnosis not present

## 2016-04-17 DIAGNOSIS — Q72812 Congenital shortening of left lower limb: Secondary | ICD-10-CM | POA: Diagnosis not present

## 2016-04-17 DIAGNOSIS — M461 Sacroiliitis, not elsewhere classified: Secondary | ICD-10-CM | POA: Diagnosis not present

## 2016-04-17 DIAGNOSIS — M50122 Cervical disc disorder at C5-C6 level with radiculopathy: Secondary | ICD-10-CM | POA: Diagnosis not present

## 2016-05-01 DIAGNOSIS — M9903 Segmental and somatic dysfunction of lumbar region: Secondary | ICD-10-CM | POA: Diagnosis not present

## 2016-05-01 DIAGNOSIS — M5136 Other intervertebral disc degeneration, lumbar region: Secondary | ICD-10-CM | POA: Diagnosis not present

## 2016-05-01 DIAGNOSIS — M461 Sacroiliitis, not elsewhere classified: Secondary | ICD-10-CM | POA: Diagnosis not present

## 2016-05-01 DIAGNOSIS — M9904 Segmental and somatic dysfunction of sacral region: Secondary | ICD-10-CM | POA: Diagnosis not present

## 2016-05-01 DIAGNOSIS — M9902 Segmental and somatic dysfunction of thoracic region: Secondary | ICD-10-CM | POA: Diagnosis not present

## 2016-05-01 DIAGNOSIS — M9901 Segmental and somatic dysfunction of cervical region: Secondary | ICD-10-CM | POA: Diagnosis not present

## 2016-05-01 DIAGNOSIS — M50122 Cervical disc disorder at C5-C6 level with radiculopathy: Secondary | ICD-10-CM | POA: Diagnosis not present

## 2016-05-01 DIAGNOSIS — M9905 Segmental and somatic dysfunction of pelvic region: Secondary | ICD-10-CM | POA: Diagnosis not present

## 2016-05-01 DIAGNOSIS — Q72812 Congenital shortening of left lower limb: Secondary | ICD-10-CM | POA: Diagnosis not present

## 2016-05-30 DIAGNOSIS — M5412 Radiculopathy, cervical region: Secondary | ICD-10-CM | POA: Diagnosis not present

## 2016-06-26 DIAGNOSIS — R8299 Other abnormal findings in urine: Secondary | ICD-10-CM | POA: Diagnosis not present

## 2016-07-24 DIAGNOSIS — M4120 Other idiopathic scoliosis, site unspecified: Secondary | ICD-10-CM | POA: Diagnosis not present

## 2016-07-24 DIAGNOSIS — M542 Cervicalgia: Secondary | ICD-10-CM | POA: Diagnosis not present

## 2016-07-24 DIAGNOSIS — M545 Low back pain: Secondary | ICD-10-CM | POA: Diagnosis not present

## 2016-07-24 DIAGNOSIS — M5412 Radiculopathy, cervical region: Secondary | ICD-10-CM | POA: Diagnosis not present

## 2016-07-30 ENCOUNTER — Other Ambulatory Visit (HOSPITAL_COMMUNITY): Payer: Self-pay | Admitting: Neurosurgery

## 2016-07-30 DIAGNOSIS — M5412 Radiculopathy, cervical region: Secondary | ICD-10-CM

## 2016-08-05 ENCOUNTER — Other Ambulatory Visit: Payer: Self-pay | Admitting: Family Medicine

## 2016-08-05 DIAGNOSIS — Z1231 Encounter for screening mammogram for malignant neoplasm of breast: Secondary | ICD-10-CM

## 2016-08-08 ENCOUNTER — Ambulatory Visit (HOSPITAL_COMMUNITY)
Admission: RE | Admit: 2016-08-08 | Discharge: 2016-08-08 | Disposition: A | Discharge status: Home / Self Care | Payer: 59 | Source: Ambulatory Visit | Attending: Neurosurgery | Admitting: Neurosurgery

## 2016-08-08 DIAGNOSIS — M5412 Radiculopathy, cervical region: Secondary | ICD-10-CM | POA: Diagnosis not present

## 2016-08-08 DIAGNOSIS — M4802 Spinal stenosis, cervical region: Secondary | ICD-10-CM | POA: Insufficient documentation

## 2016-08-26 DIAGNOSIS — M542 Cervicalgia: Secondary | ICD-10-CM | POA: Diagnosis not present

## 2016-08-26 DIAGNOSIS — M5412 Radiculopathy, cervical region: Secondary | ICD-10-CM | POA: Diagnosis not present

## 2016-08-26 DIAGNOSIS — M545 Low back pain: Secondary | ICD-10-CM | POA: Diagnosis not present

## 2016-08-26 DIAGNOSIS — M4120 Other idiopathic scoliosis, site unspecified: Secondary | ICD-10-CM | POA: Diagnosis not present

## 2016-08-26 DIAGNOSIS — R030 Elevated blood-pressure reading, without diagnosis of hypertension: Secondary | ICD-10-CM | POA: Diagnosis not present

## 2016-08-30 ENCOUNTER — Ambulatory Visit
Admission: RE | Admit: 2016-08-30 | Discharge: 2016-08-30 | Disposition: A | Discharge status: Home / Self Care | Payer: 59 | Source: Ambulatory Visit | Attending: Family Medicine | Admitting: Family Medicine

## 2016-08-30 DIAGNOSIS — Z1231 Encounter for screening mammogram for malignant neoplasm of breast: Secondary | ICD-10-CM | POA: Diagnosis not present

## 2016-11-18 DIAGNOSIS — H52203 Unspecified astigmatism, bilateral: Secondary | ICD-10-CM | POA: Diagnosis not present

## 2016-12-06 DIAGNOSIS — L821 Other seborrheic keratosis: Secondary | ICD-10-CM | POA: Diagnosis not present

## 2016-12-06 DIAGNOSIS — D2262 Melanocytic nevi of left upper limb, including shoulder: Secondary | ICD-10-CM | POA: Diagnosis not present

## 2016-12-06 DIAGNOSIS — D2261 Melanocytic nevi of right upper limb, including shoulder: Secondary | ICD-10-CM | POA: Diagnosis not present

## 2016-12-06 DIAGNOSIS — L814 Other melanin hyperpigmentation: Secondary | ICD-10-CM | POA: Diagnosis not present

## 2016-12-06 DIAGNOSIS — L819 Disorder of pigmentation, unspecified: Secondary | ICD-10-CM | POA: Diagnosis not present

## 2016-12-06 DIAGNOSIS — D225 Melanocytic nevi of trunk: Secondary | ICD-10-CM | POA: Diagnosis not present

## 2016-12-06 DIAGNOSIS — D1801 Hemangioma of skin and subcutaneous tissue: Secondary | ICD-10-CM | POA: Diagnosis not present

## 2016-12-06 DIAGNOSIS — D220 Melanocytic nevi of lip: Secondary | ICD-10-CM | POA: Diagnosis not present

## 2016-12-06 DIAGNOSIS — D2372 Other benign neoplasm of skin of left lower limb, including hip: Secondary | ICD-10-CM | POA: Diagnosis not present

## 2016-12-24 DIAGNOSIS — R0981 Nasal congestion: Secondary | ICD-10-CM | POA: Diagnosis not present

## 2016-12-24 DIAGNOSIS — M810 Age-related osteoporosis without current pathological fracture: Secondary | ICD-10-CM | POA: Diagnosis not present

## 2016-12-24 DIAGNOSIS — Z0000 Encounter for general adult medical examination without abnormal findings: Secondary | ICD-10-CM | POA: Diagnosis not present

## 2016-12-24 DIAGNOSIS — M4722 Other spondylosis with radiculopathy, cervical region: Secondary | ICD-10-CM | POA: Diagnosis not present

## 2016-12-24 DIAGNOSIS — K219 Gastro-esophageal reflux disease without esophagitis: Secondary | ICD-10-CM | POA: Diagnosis not present

## 2016-12-24 DIAGNOSIS — M859 Disorder of bone density and structure, unspecified: Secondary | ICD-10-CM | POA: Diagnosis not present

## 2016-12-24 MED FILL — FLUTICASONE PROP 50 MCG SPR: 50 | 30 days supply | Qty: 16 | Fill #0

## 2017-02-05 DIAGNOSIS — H02831 Dermatochalasis of right upper eyelid: Secondary | ICD-10-CM | POA: Diagnosis not present

## 2017-02-05 DIAGNOSIS — H02834 Dermatochalasis of left upper eyelid: Secondary | ICD-10-CM | POA: Diagnosis not present

## 2017-04-16 MED FILL — FLUTICASONE PROP 50 MCG SPR: 50 | 30 days supply | Qty: 16 | Fill #1

## 2017-04-24 DIAGNOSIS — Z23 Encounter for immunization: Secondary | ICD-10-CM | POA: Diagnosis not present

## 2017-06-30 MED FILL — FLUTICASONE PROP 50 MCG SPR: 50 | 30 days supply | Qty: 16 | Fill #2

## 2017-07-25 ENCOUNTER — Other Ambulatory Visit: Payer: Self-pay | Admitting: Family Medicine

## 2017-07-25 DIAGNOSIS — Z1231 Encounter for screening mammogram for malignant neoplasm of breast: Secondary | ICD-10-CM

## 2017-08-26 MED FILL — SHINGRIX 50 MCG SUS: 50 | 1 days supply | Qty: 1 | Fill #0

## 2017-09-03 ENCOUNTER — Ambulatory Visit
Admission: RE | Admit: 2017-09-03 | Discharge: 2017-09-03 | Disposition: A | Discharge status: Home / Self Care | Payer: 59 | Source: Ambulatory Visit | Attending: Family Medicine | Admitting: Family Medicine

## 2017-09-03 DIAGNOSIS — Z1231 Encounter for screening mammogram for malignant neoplasm of breast: Secondary | ICD-10-CM

## 2017-09-04 ENCOUNTER — Other Ambulatory Visit: Payer: Self-pay | Admitting: Family Medicine

## 2017-09-04 DIAGNOSIS — R928 Other abnormal and inconclusive findings on diagnostic imaging of breast: Secondary | ICD-10-CM

## 2017-09-10 ENCOUNTER — Ambulatory Visit
Admission: RE | Admit: 2017-09-10 | Discharge: 2017-09-10 | Disposition: A | Discharge status: Home / Self Care | Payer: 59 | Source: Ambulatory Visit | Attending: Family Medicine | Admitting: Family Medicine

## 2017-09-10 ENCOUNTER — Ambulatory Visit: Payer: 59

## 2017-09-10 DIAGNOSIS — R922 Inconclusive mammogram: Secondary | ICD-10-CM | POA: Diagnosis not present

## 2017-09-10 DIAGNOSIS — R928 Other abnormal and inconclusive findings on diagnostic imaging of breast: Secondary | ICD-10-CM

## 2017-09-10 MED FILL — FLUTICASONE PROP 50 MCG SPR: 50 | 60 days supply | Qty: 16 | Fill #0

## 2017-11-19 IMAGING — MR MR CERVICAL SPINE W/O CM
4 of 5 series · 25 of 48 positions shown · non-contrast
Comparison: None.

CLINICAL DATA: Cervical radiculopathy

EXAM:
MRI CERVICAL SPINE WITHOUT CONTRAST
TECHNIQUE: Multiplanar, multisequence MR imaging of the cervical spine was
performed. No intravenous contrast was administered.

[Series 2: T2 · sagittal · 3.0mm · 0.43mm/px · 6 of 13 slices shown (1 of 2)]
[im 1/13]
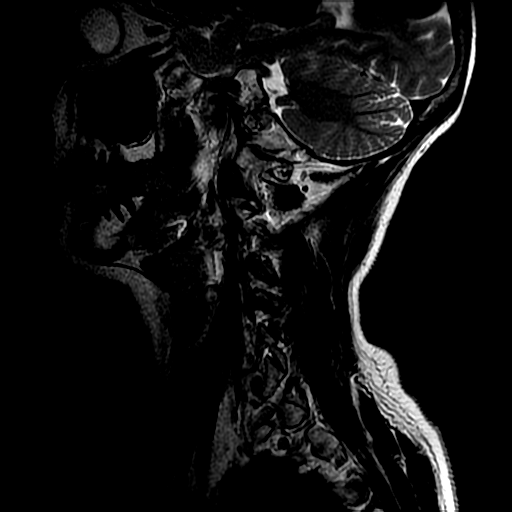
[im 3/13]
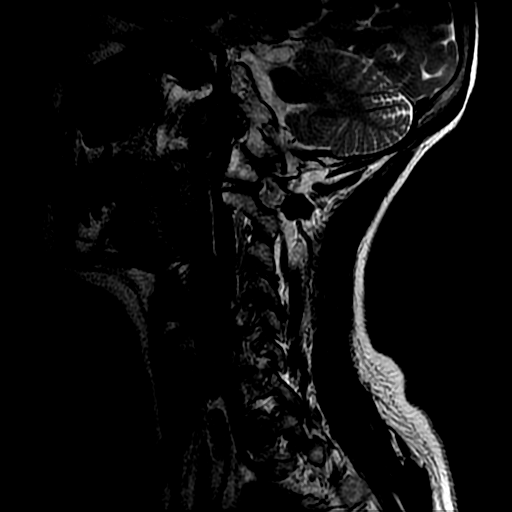
[im 5/13]
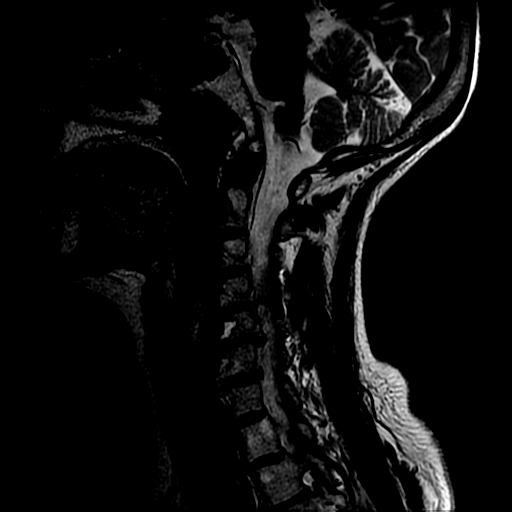
[im 8/13]
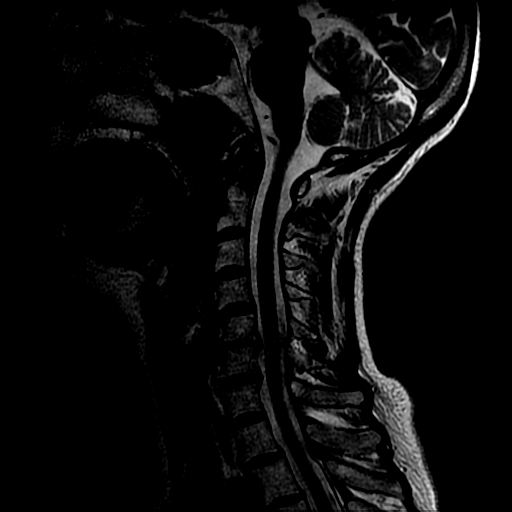
[im 10/13]
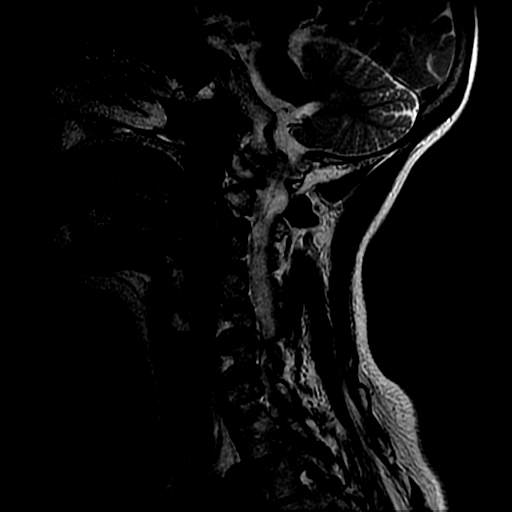
[im 13/13]
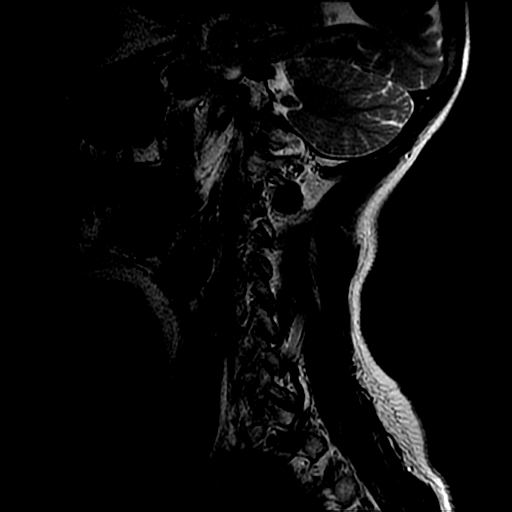

[Series 3: T1 · sagittal · 3.0mm · 0.43mm/px · 7 of 13 slices shown]
[im 1/13]
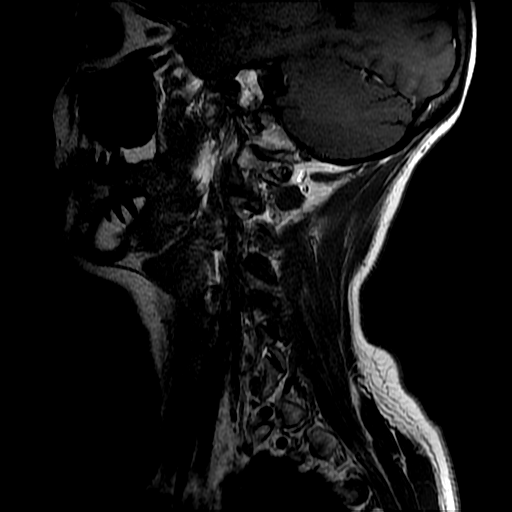
[im 3/13]
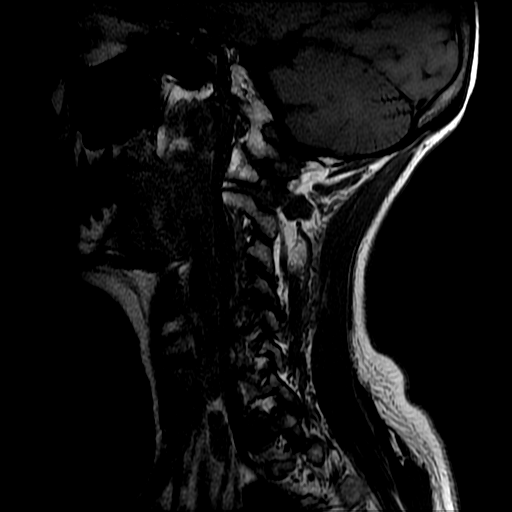
[im 5/13]
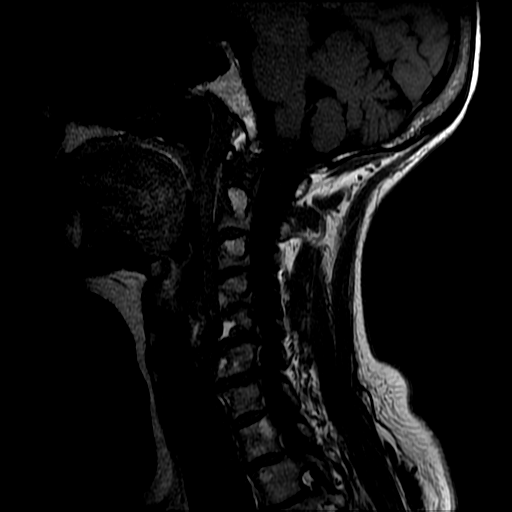
[im 7/13]
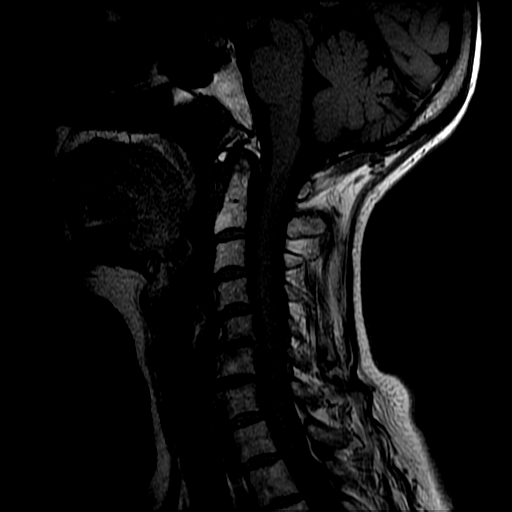
[im 9/13]
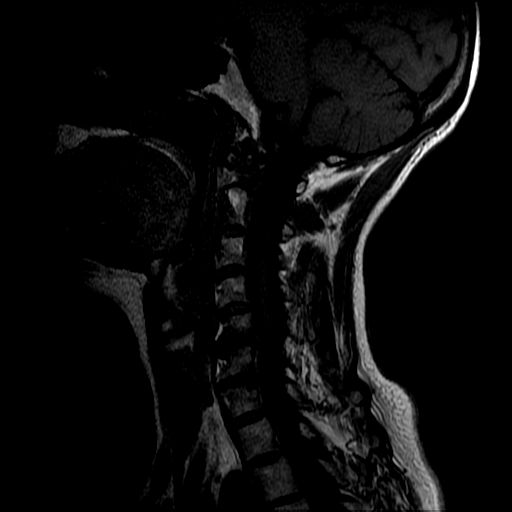
[im 11/13]
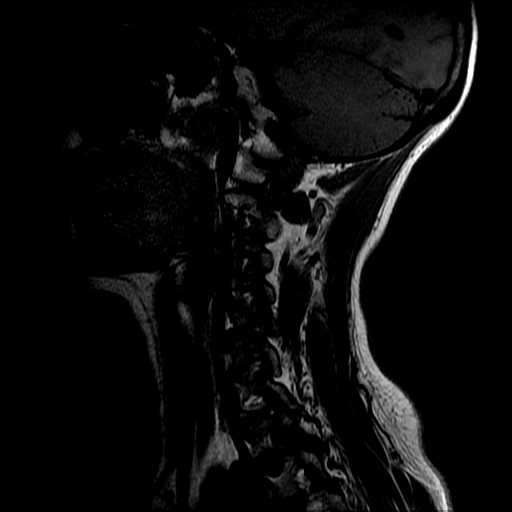
[im 13/13]
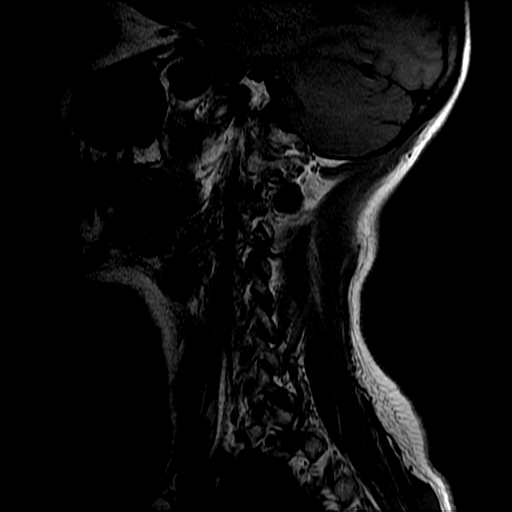

[Series 4: T2 · axial · 3.0mm · 0.78mm/px · z∈[-78,+14]mm · 8 of 28 slices shown (2 of 2)]
[im 1/28]
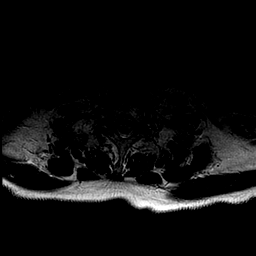
[im 5/28]
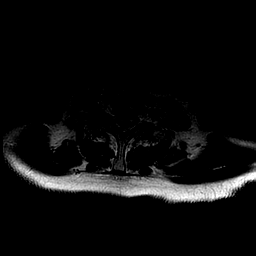
[im 9/28]
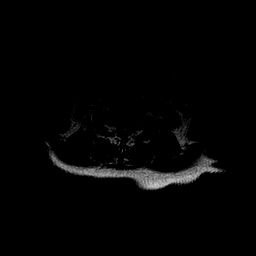
[im 13/28]
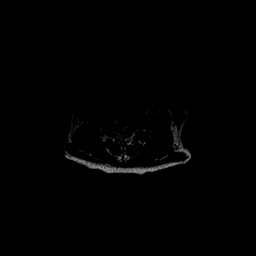
[im 15/28]
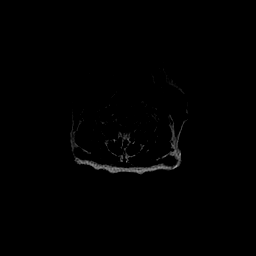
[im 19/28]
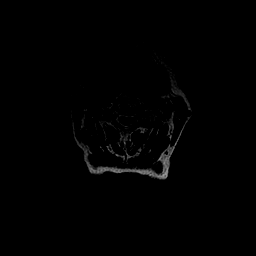
[im 23/28]
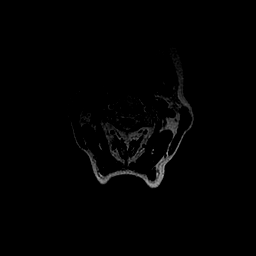
[im 28/28]
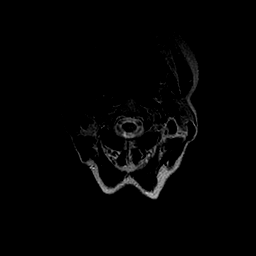

[Series 5: sag ir · sagittal · 3.0mm · 0.43mm/px · 4 of 13 slices shown]
[im 1/13]
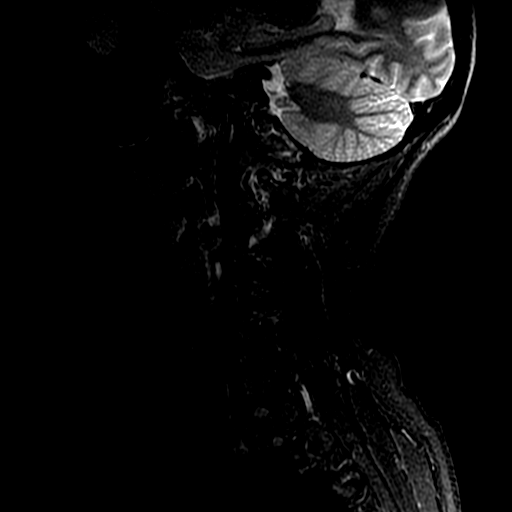
[im 3/13]
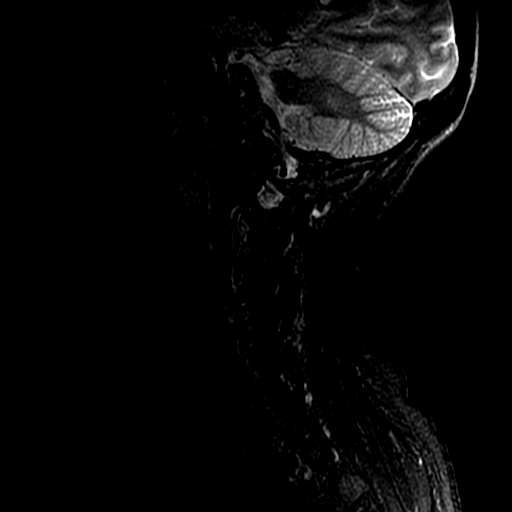
[im 7/13]
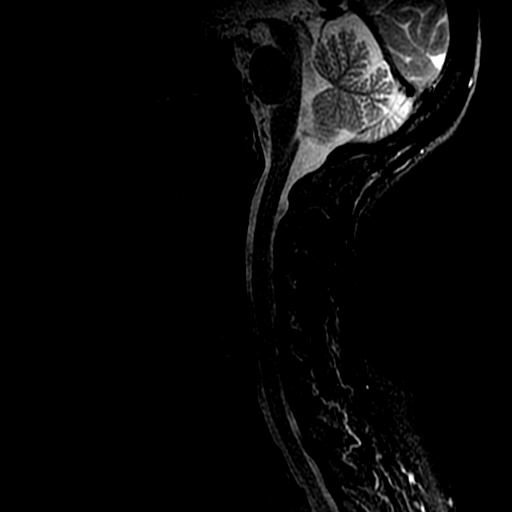
[im 11/13]
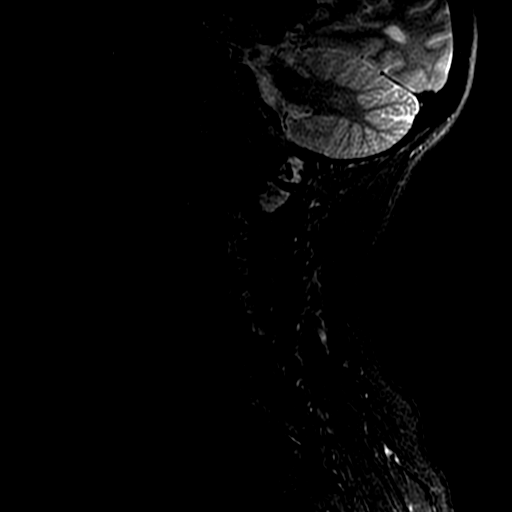

[25 of 48 positions shown; findings below may reference images not displayed]

FINDINGS: Alignment: Mild anterolisthesis C4-5.  Mild retrolisthesis C5-6

Vertebrae: Negative for fracture or mass.  Normal bone marrow.

Cord: Normal signal and morphology

Posterior Fossa, vertebral arteries, paraspinal tissues: Negative

Disc levels:

C2-3:  Negative

C3-4:  Left-sided facet degeneration.  No significant stenosis

C4-5: Left-sided facet degeneration with left foraminal narrowing.
No significant spinal stenosis.

C5-6: Disc degeneration and spondylosis. Mild diffuse uncinate
spurring causing mild spinal stenosis. Moderate foraminal stenosis
bilaterally. Bilateral facet degeneration

C6-7: Disc degeneration and spondylosis with mild uncinate spurring.
Mild foraminal narrowing bilaterally. Mild facet degeneration
bilaterally.

C7-T1: Negative
IMPRESSION: Mild left foraminal narrowing C4-5 due to spurring

Moderate foraminal stenosis bilaterally at C5-6. Mild spinal
stenosis.

Mild foraminal narrowing bilaterally C6-7 due to spurring.

## 2017-11-24 MED FILL — FLUTICASONE PROP 50 MCG SPR: 50 | 60 days supply | Qty: 16 | Fill #1

## 2017-12-31 ENCOUNTER — Other Ambulatory Visit: Payer: Self-pay | Admitting: Family Medicine

## 2017-12-31 DIAGNOSIS — Z681 Body mass index (BMI) 19 or less, adult: Secondary | ICD-10-CM | POA: Diagnosis not present

## 2017-12-31 DIAGNOSIS — R5381 Other malaise: Secondary | ICD-10-CM

## 2017-12-31 DIAGNOSIS — E7800 Pure hypercholesterolemia, unspecified: Secondary | ICD-10-CM | POA: Diagnosis not present

## 2017-12-31 DIAGNOSIS — M25569 Pain in unspecified knee: Secondary | ICD-10-CM | POA: Diagnosis not present

## 2017-12-31 DIAGNOSIS — M4722 Other spondylosis with radiculopathy, cervical region: Secondary | ICD-10-CM | POA: Diagnosis not present

## 2017-12-31 DIAGNOSIS — R0981 Nasal congestion: Secondary | ICD-10-CM | POA: Diagnosis not present

## 2017-12-31 DIAGNOSIS — K219 Gastro-esophageal reflux disease without esophagitis: Secondary | ICD-10-CM | POA: Diagnosis not present

## 2017-12-31 DIAGNOSIS — M8588 Other specified disorders of bone density and structure, other site: Secondary | ICD-10-CM

## 2017-12-31 DIAGNOSIS — Z0000 Encounter for general adult medical examination without abnormal findings: Secondary | ICD-10-CM | POA: Diagnosis not present

## 2017-12-31 DIAGNOSIS — R636 Underweight: Secondary | ICD-10-CM | POA: Diagnosis not present

## 2017-12-31 DIAGNOSIS — M859 Disorder of bone density and structure, unspecified: Secondary | ICD-10-CM | POA: Diagnosis not present

## 2018-01-09 ENCOUNTER — Other Ambulatory Visit (HOSPITAL_COMMUNITY): Payer: Self-pay | Admitting: Family Medicine

## 2018-01-09 DIAGNOSIS — E7800 Pure hypercholesterolemia, unspecified: Secondary | ICD-10-CM

## 2018-01-14 ENCOUNTER — Ambulatory Visit (HOSPITAL_COMMUNITY)
Admission: RE | Admit: 2018-01-14 | Discharge: 2018-01-14 | Disposition: A | Discharge status: Home / Self Care | Payer: 59 | Source: Ambulatory Visit | Attending: Family Medicine | Admitting: Family Medicine

## 2018-01-14 DIAGNOSIS — E7800 Pure hypercholesterolemia, unspecified: Secondary | ICD-10-CM | POA: Diagnosis not present

## 2018-01-21 DIAGNOSIS — D2262 Melanocytic nevi of left upper limb, including shoulder: Secondary | ICD-10-CM | POA: Diagnosis not present

## 2018-01-21 DIAGNOSIS — D1801 Hemangioma of skin and subcutaneous tissue: Secondary | ICD-10-CM | POA: Diagnosis not present

## 2018-01-21 DIAGNOSIS — L814 Other melanin hyperpigmentation: Secondary | ICD-10-CM | POA: Diagnosis not present

## 2018-01-21 DIAGNOSIS — D225 Melanocytic nevi of trunk: Secondary | ICD-10-CM | POA: Diagnosis not present

## 2018-01-21 DIAGNOSIS — L821 Other seborrheic keratosis: Secondary | ICD-10-CM | POA: Diagnosis not present

## 2018-01-21 DIAGNOSIS — D2372 Other benign neoplasm of skin of left lower limb, including hip: Secondary | ICD-10-CM | POA: Diagnosis not present

## 2018-01-21 DIAGNOSIS — L720 Epidermal cyst: Secondary | ICD-10-CM | POA: Diagnosis not present

## 2018-02-26 ENCOUNTER — Ambulatory Visit
Admission: RE | Admit: 2018-02-26 | Discharge: 2018-02-26 | Disposition: A | Discharge status: Home / Self Care | Payer: 59 | Source: Ambulatory Visit | Attending: Family Medicine | Admitting: Family Medicine

## 2018-02-26 DIAGNOSIS — M810 Age-related osteoporosis without current pathological fracture: Secondary | ICD-10-CM | POA: Diagnosis not present

## 2018-02-26 DIAGNOSIS — M85852 Other specified disorders of bone density and structure, left thigh: Secondary | ICD-10-CM | POA: Diagnosis not present

## 2018-02-26 DIAGNOSIS — Z780 Asymptomatic menopausal state: Secondary | ICD-10-CM | POA: Diagnosis not present

## 2018-02-26 DIAGNOSIS — M8588 Other specified disorders of bone density and structure, other site: Secondary | ICD-10-CM

## 2018-03-05 DIAGNOSIS — M810 Age-related osteoporosis without current pathological fracture: Secondary | ICD-10-CM | POA: Diagnosis not present

## 2018-03-06 MED FILL — IBANDRONATE NA 150 MG TAB: 150 | 84 days supply | Qty: 3 | Fill #0

## 2018-03-30 MED FILL — FLUTICASONE PROP 50 MCG SPR: 50 | 60 days supply | Qty: 16 | Fill #2

## 2018-04-07 DIAGNOSIS — H52203 Unspecified astigmatism, bilateral: Secondary | ICD-10-CM | POA: Diagnosis not present

## 2018-06-22 MED FILL — IBANDRONATE SODIUM 150 MG T: 150 | 84 days supply | Qty: 3 | Fill #1

## 2018-06-22 MED FILL — FLUTICASONE PROP 50 MCG SPR: 50 | 60 days supply | Qty: 16 | Fill #0

## 2018-06-26 DIAGNOSIS — S61409A Unspecified open wound of unspecified hand, initial encounter: Secondary | ICD-10-CM | POA: Diagnosis not present

## 2018-06-26 DIAGNOSIS — J029 Acute pharyngitis, unspecified: Secondary | ICD-10-CM | POA: Diagnosis not present

## 2018-06-26 DIAGNOSIS — R05 Cough: Secondary | ICD-10-CM | POA: Diagnosis not present

## 2018-06-26 DIAGNOSIS — Z7689 Persons encountering health services in other specified circumstances: Secondary | ICD-10-CM | POA: Diagnosis not present

## 2018-06-26 DIAGNOSIS — W460XXA Contact with hypodermic needle, initial encounter: Secondary | ICD-10-CM | POA: Diagnosis not present

## 2018-06-26 MED FILL — BENZONATATE 200 MG CAP: 200 | 7 days supply | Qty: 21 | Fill #0

## 2018-06-26 MED FILL — predniSONE 10 MG (21) TBPK: 10 | 6 days supply | Qty: 21 | Fill #0

## 2018-06-26 MED FILL — LIDOCAINE 2% VISCOUS SOLN: 2 | 2 days supply | Qty: 200 | Fill #0

## 2018-06-29 MED FILL — AZITHROMYCIN 250 MG TABLET: 250 | 5 days supply | Qty: 6 | Fill #0

## 2018-07-01 DIAGNOSIS — J189 Pneumonia, unspecified organism: Secondary | ICD-10-CM | POA: Diagnosis not present

## 2018-07-01 DIAGNOSIS — R0602 Shortness of breath: Secondary | ICD-10-CM | POA: Diagnosis not present

## 2018-07-01 DIAGNOSIS — R05 Cough: Secondary | ICD-10-CM | POA: Diagnosis not present

## 2018-07-01 MED FILL — levoFLOXacin 750 MG TABS: 750 | 7 days supply | Qty: 7 | Fill #0

## 2018-07-02 MED FILL — GUAIATUSSIN AC LIQUID: 100-10 | 7 days supply | Qty: 150 | Fill #0

## 2018-07-06 MED FILL — AMOX-CLAV 875-125 MG TABLET: 875-125 | 7 days supply | Qty: 14 | Fill #0

## 2018-08-10 ENCOUNTER — Other Ambulatory Visit: Payer: Self-pay | Admitting: Family Medicine

## 2018-08-10 DIAGNOSIS — Z1231 Encounter for screening mammogram for malignant neoplasm of breast: Secondary | ICD-10-CM

## 2018-08-11 DIAGNOSIS — L259 Unspecified contact dermatitis, unspecified cause: Secondary | ICD-10-CM | POA: Diagnosis not present

## 2018-08-11 DIAGNOSIS — L239 Allergic contact dermatitis, unspecified cause: Secondary | ICD-10-CM | POA: Diagnosis not present

## 2018-09-30 MED FILL — FLUTICASONE PROP 50 MCG SPR: 50 | 60 days supply | Qty: 16 | Fill #1

## 2018-10-05 ENCOUNTER — Other Ambulatory Visit: Payer: Self-pay

## 2018-10-05 ENCOUNTER — Ambulatory Visit
Admission: RE | Admit: 2018-10-05 | Discharge: 2018-10-05 | Disposition: A | Discharge status: Home / Self Care | Payer: 59 | Source: Ambulatory Visit | Attending: Family Medicine | Admitting: Family Medicine

## 2018-10-05 DIAGNOSIS — Z1231 Encounter for screening mammogram for malignant neoplasm of breast: Secondary | ICD-10-CM | POA: Diagnosis not present

## 2018-10-07 ENCOUNTER — Other Ambulatory Visit: Payer: Self-pay | Admitting: Family Medicine

## 2018-10-07 DIAGNOSIS — R928 Other abnormal and inconclusive findings on diagnostic imaging of breast: Secondary | ICD-10-CM

## 2018-10-14 ENCOUNTER — Ambulatory Visit
Admission: RE | Admit: 2018-10-14 | Discharge: 2018-10-14 | Disposition: A | Discharge status: Home / Self Care | Payer: 59 | Source: Ambulatory Visit | Attending: Family Medicine | Admitting: Family Medicine

## 2018-10-14 ENCOUNTER — Ambulatory Visit: Payer: 59

## 2018-10-14 ENCOUNTER — Other Ambulatory Visit: Payer: Self-pay

## 2018-10-14 DIAGNOSIS — R928 Other abnormal and inconclusive findings on diagnostic imaging of breast: Secondary | ICD-10-CM

## 2018-10-14 DIAGNOSIS — R922 Inconclusive mammogram: Secondary | ICD-10-CM | POA: Diagnosis not present

## 2018-10-21 DIAGNOSIS — Z03818 Encounter for observation for suspected exposure to other biological agents ruled out: Secondary | ICD-10-CM | POA: Diagnosis not present

## 2018-10-22 MED FILL — IBANDRONATE NA 150 MG TAB: 150 | 84 days supply | Qty: 3 | Fill #2

## 2018-11-26 MED FILL — FLUTICASONE PROP 50 MCG SPR: 50 | 60 days supply | Qty: 16 | Fill #2

## 2018-12-07 DIAGNOSIS — N281 Cyst of kidney, acquired: Secondary | ICD-10-CM | POA: Diagnosis not present

## 2018-12-17 DIAGNOSIS — H5213 Myopia, bilateral: Secondary | ICD-10-CM | POA: Diagnosis not present

## 2018-12-17 DIAGNOSIS — H52203 Unspecified astigmatism, bilateral: Secondary | ICD-10-CM | POA: Diagnosis not present

## 2019-01-19 DIAGNOSIS — M4722 Other spondylosis with radiculopathy, cervical region: Secondary | ICD-10-CM | POA: Diagnosis not present

## 2019-01-19 DIAGNOSIS — R0981 Nasal congestion: Secondary | ICD-10-CM | POA: Diagnosis not present

## 2019-01-19 DIAGNOSIS — M810 Age-related osteoporosis without current pathological fracture: Secondary | ICD-10-CM | POA: Diagnosis not present

## 2019-01-19 DIAGNOSIS — Z79899 Other long term (current) drug therapy: Secondary | ICD-10-CM | POA: Diagnosis not present

## 2019-01-19 DIAGNOSIS — Z8701 Personal history of pneumonia (recurrent): Secondary | ICD-10-CM | POA: Diagnosis not present

## 2019-01-19 MED FILL — IBANDRONATE NA 150 MG TAB: 150 | 84 days supply | Qty: 3 | Fill #3

## 2019-01-28 MED FILL — PROAIR HFA 90 MCG INHALER: 108 (90 BAS | 25 days supply | Qty: 9 | Fill #0

## 2019-03-11 DIAGNOSIS — L814 Other melanin hyperpigmentation: Secondary | ICD-10-CM | POA: Diagnosis not present

## 2019-03-11 DIAGNOSIS — D3617 Benign neoplasm of peripheral nerves and autonomic nervous system of trunk, unspecified: Secondary | ICD-10-CM | POA: Diagnosis not present

## 2019-03-11 DIAGNOSIS — D2372 Other benign neoplasm of skin of left lower limb, including hip: Secondary | ICD-10-CM | POA: Diagnosis not present

## 2019-03-11 DIAGNOSIS — L821 Other seborrheic keratosis: Secondary | ICD-10-CM | POA: Diagnosis not present

## 2019-03-11 DIAGNOSIS — D225 Melanocytic nevi of trunk: Secondary | ICD-10-CM | POA: Diagnosis not present

## 2019-03-11 DIAGNOSIS — D1801 Hemangioma of skin and subcutaneous tissue: Secondary | ICD-10-CM | POA: Diagnosis not present

## 2019-03-11 DIAGNOSIS — D485 Neoplasm of uncertain behavior of skin: Secondary | ICD-10-CM | POA: Diagnosis not present

## 2019-03-11 DIAGNOSIS — D2261 Melanocytic nevi of right upper limb, including shoulder: Secondary | ICD-10-CM | POA: Diagnosis not present

## 2019-04-05 DIAGNOSIS — R002 Palpitations: Secondary | ICD-10-CM | POA: Diagnosis not present

## 2019-04-06 MED FILL — FLUTICASONE PROP 50 MCG SPR: 50 | 60 days supply | Qty: 16 | Fill #0

## 2019-04-09 ENCOUNTER — Telehealth: Payer: Self-pay

## 2019-04-09 NOTE — Telephone Encounter (Signed)
NOTES ON FILE FROM DR [facility] MCNEILL [phone], SENT REFERRAL TO SCHEDULING

## 2019-04-14 MED FILL — IBANDRONATE NA 150 MG TAB: 150 | 84 days supply | Qty: 3 | Fill #0

## 2019-05-17 ENCOUNTER — Ambulatory Visit: Payer: 59 | Admitting: Cardiovascular Disease

## 2019-05-27 ENCOUNTER — Encounter: Payer: Self-pay | Admitting: Cardiovascular Disease

## 2019-05-27 ENCOUNTER — Other Ambulatory Visit: Payer: Self-pay

## 2019-05-27 ENCOUNTER — Ambulatory Visit: Payer: PPO | Admitting: Cardiovascular Disease

## 2019-05-27 VITALS — BP 138/76 | HR 77 | Ht 66.0 in | Wt 117.8 lb

## 2019-05-27 DIAGNOSIS — R0602 Shortness of breath: Secondary | ICD-10-CM | POA: Diagnosis not present

## 2019-05-27 NOTE — Patient Instructions (Addendum)
Medication Instructions:  Your physician recommends that you continue on your current medications as directed. Please refer to the Current Medication list given to you today.  *If you need a refill on your cardiac medications before your next appointment, please call your pharmacy*  Lab Work: None Ordered If you have labs (blood work) drawn today and your tests are completely normal, you will receive your results only by: Marland Kitchen MyChart Message (if you have MyChart) OR . A paper copy in the mail If you have any lab test that is abnormal or we need to change your treatment, we will call you to review the results.  Testing/Procedures: Your physician has requested that you have an echocardiogram. Echocardiography is a painless test that uses sound waves to create images of your heart. It provides your doctor with information about the size and shape of your heart and how well your heart's chambers and valves are working. This procedure takes approximately one hour. There are no restrictions for this procedure.  Your physician has requested that you have an exercise tolerance test. For further information please visit [facility]. Please also follow instruction sheet, as given. DO NOT Take Toprol (Metoprolol) for 2 days prior to your test Do not eat, drink, or use tobacco products four hours before your test. Dress in clothes and shoes that you can exercise in.   Your Pre-procedure COVID-19 Testing will be done on _______at [facility] at [id number], [place], [place] [id number]. Once you arrive at the testing site, stay in the right hand lane, go under the building overhang not the tent. If you are tested under the tent your results may not be back before your procedure. Please be on time for your appointment.  After your swab you will be given a mask to wear and instructed to go home and quarantine/no visitors until after your procedure. If you test positive you  will be notified and your procedure will be cancelled.    Follow-Up: At [facility], you and your health needs are our priority.  As part of our continuing mission to provide you with exceptional heart care, we have created designated Provider Care Teams.  These Care Teams include your primary Cardiologist (physician) and Advanced Practice Providers (APPs -  Physician Assistants and Nurse Practitioners) who all work together to provide you with the care you need, when you need it.  Your next appointment:   3 month(s)  The format for your next appointment:   In Person  Provider:   Mertie Moores, MD

## 2019-05-27 NOTE — Progress Notes (Signed)
Cardiology Office Note:    Date:  05/27/2019   ID:  Sandra Park, [place] 22-Oct-1950, MRN [id number]  PCP:  Kathyrn Lass, MD  Cardiologist:  Elmo Shumard  Electrophysiologist:  None   Referring MD: Cari Caraway, MD   No chief complaint on file.   History of Present Illness:    Sandra Park is a 69 y.o. female with a hx of palpitations in the past.  We were asked to see her today by Dr. Leonides Schanz for further evaluation of these palpitations.  She has had palpitations for years. Wore a monitor for several weeks back in 2017  In Dec. Developed more palpitations Associated with a brief episode of lightheadedness.  Felt fatigued afterwards.  Has an occasional  "emptyness"  in the center of her chest  Works out regularly .   [place] going [place] " in [place] quickly , recovers quickly   Has had hyperlipidemia. Coronary calcium score in Sept. 2019 was 0  Random episodes of chest pressure.   Not associated with exercise  May last for 5-10 minute Has been walking for year [place] up [place] farm .   Now raises sweet potatoes.  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  Non smoker.  Had pneumonia a year ago .  Labs from her primary medical doctor's office in December, 2020 reveals glucose of 80.  Creatinine is 0.66.  Sodium is 140.  Potassium is 5.0.  White blood cell count is 6.1.  Hemoglobin is 13.2.  TSH is 1.23.  She is reportedly had an x-ray at [facility] although I cannot find that report on the chart.  He had supposedly so it showed some nodules that are being followed by her primary medical doctor.  Past Medical History:  Diagnosis Date  . Arthritis   . GERD (gastroesophageal reflux disease)   . History of colitis 09/06/2014  . History of kidney stones   . Hypoproteinemia ([place])   . Nasal congestion   . Osteopenia   . Osteoporosis   . Pure hypercholesterolemia   . Renal cyst   . SVT (supraventricular tachycardia) (HCC)     Past Surgical  History:  Procedure Laterality Date  . COLONOSCOPY  2006, 2016   normal  . LAPAROSCOPY Left 1989,& 90's   salpingoopherectomy cyst x2    Current Medications: Current Meds  Medication Sig  . aspirin EC 81 MG tablet Take 81 mg by mouth daily.  . Calcium Carbonate (CALCIUM 600 PO) Take 1 tablet by mouth daily.   . Cholecalciferol (VITAMIN D3) 1000 units CAPS Take 2,000 Units by mouth daily.  . Cyanocobalamin (VITAMIN B12) 500 MCG TABS Take 1 tablet by mouth daily.  . famotidine (PEPCID) 20 MG tablet Take 20 mg by mouth daily.  . fluticasone (FLONASE) 50 MCG/ACT nasal spray Place 2 sprays into both nostrils daily.  Marland Kitchen ibandronate (BONIVA) 150 MG tablet Take 150 mg by mouth every 30 (thirty) days.  Marland Kitchen ibuprofen (ADVIL,MOTRIN) 200 MG tablet Take 800 mg by mouth every 6 (six) hours as needed for moderate pain.  . Magnesium 400 MG TABS Take 1 tablet by mouth daily.  . Multiple Vitamin (MULTIVITAMIN) tablet Take 1 tablet by mouth daily.  . naproxen sodium (ALEVE) 220 MG tablet Take 220 mg by mouth daily as needed.  . Plant Sterols and Stanols (CHOLESTOFF PLUS) 450 MG CAPS Take 1 capsule by mouth daily.  Marland Kitchen PROAIR HFA 108 (90  Base) MCG/ACT inhaler Inhale 2 puffs into the lungs as needed.  . Probiotic Product (PROBIOTIC DAILY PO) Take 1 tablet by mouth daily.  . Turmeric (QC TUMERIC COMPLEX) 500 MG CAPS Take 1,000 mg by mouth daily.  . vitamin C (ASCORBIC ACID) 500 MG tablet Take 500 mg by mouth daily.     Allergies:   Patient has no known allergies.   Social History   Socioeconomic History  . Marital status: Married    Spouse name: Not on file  . Number of children: Not on file  . Years of education: Not on file  . Highest education level: Not on file  Occupational History  . Not on file  Tobacco Use  . Smoking status: Never Smoker  . Smokeless tobacco: Never Used  Substance and Sexual Activity  . Alcohol use: No  . Drug use: No  . Sexual activity: Not on file  Other Topics Concern   . 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 Difficulty of Paying Living Expenses: Not on file  Food Insecurity:   . Worried About Charity fundraiser in the Last Year: Not on file  . Ran Out of Food in the Last Year: Not on file  Transportation Needs:   . Lack of Transportation (Medical): Not on file  . Lack of Transportation (Non-Medical): Not on file  Physical Activity:   . Days of Exercise per Week: Not on file  . Minutes of Exercise per Session: Not on file  Stress:   . Feeling of Stress : Not on file  Social Connections:   . Frequency of Communication with Friends and Family: Not on file  . Frequency of Social Gatherings with Friends and Family: Not on file  . Attends Religious Services: Not on file  . Active Member of Clubs or Organizations: Not on file  . Attends Archivist Meetings: Not on file  . Marital Status: Not on file     Family History: The patient's family history includes Arthritis in her father and mother; Atrial fibrillation in her father and mother; Clotting disorder in her father; Heart attack in her father; Heart disease in her maternal grandfather; Hypertension in her father, maternal grandfather, and mother; Pneumonia in her mother; Suicidality in her paternal grandmother. There is no history of Colon cancer.  ROS:   Please see the history of present illness.     All other systems reviewed and are negative.  EKGs/Labs/Other Studies Reviewed:    The following studies were reviewed today:   EKG:  Jan. 28, 2021:  NSR at 77,  Occasional PVCs   Recent Labs: No results found for requested labs within last 8760 hours.  Recent Lipid Panel No results found for: CHOL, TRIG, HDL, CHOLHDL, VLDL, LDLCALC, LDLDIRECT  Physical Exam:    VS:  BP 138/76   Pulse 77   Ht 5\' 6"  (1.676 m)   Wt 117 lb 12.8 oz  (53.4 kg)   SpO2 99%   BMI 19.01 kg/m     Wt Readings from Last 3 Encounters:  05/27/19 117 lb 12.8 oz (53.4 kg)  12/22/14 120 lb (54.4 kg)  12/12/14 120 lb (54.4 kg)     GEN:  Well nourished, well developed in no acute distress HEENT: Normal NECK:  No JVD; No carotid bruits LYMPHATICS: No lymphadenopathy CARDIAC: RRR, soft systolic murmur  RESPIRATORY:  Clear to auscultation without rales, wheezing or rhonchi  ABDOMEN: Soft, non-tender, non-distended MUSCULOSKELETAL:  No edema; No deformity  SKIN: Warm and dry NEUROLOGIC:  Alert and oriented x 3 PSYCHIATRIC:  Normal affect   ASSESSMENT:    1. Shortness of breath    PLAN:    In order of problems listed above:  1.  Shortness of breath with exertion: Maribell has been walking for most of her life.  She has a walking group and has been walking in the park for years.  Recently she has developed some shortness of breath with exertion.  She also describes some unusual chest pains that do not occur with exertion. She had a coronary calcium score of 0 last year.  I doubt that she has obstructive coronary artery disease but she may have some other lung issue.  We will schedule her for a treadmill test.  We will request that we measure pulse oximetry throughout the test and recorded at every stage and at the end of exercise.  We will also get an echocardiogram to evaluate her cardiac function and valvular function.  She has just a very soft systolic murmur that might be an outflow murmur.  I will see her again in 3 months.  Medication Adjustments/Labs and Tests Ordered: Current medicines are reviewed at length with the patient today.  Concerns regarding medicines are outlined above.  Orders Placed This Encounter  Procedures  . Exercise Tolerance Test  . EKG 12-Lead  . ECHOCARDIOGRAM COMPLETE   No orders of the defined types were placed in this encounter.   Patient Instructions  Medication Instructions:  Your physician recommends  that you continue on your current medications as directed. Please refer to the Current Medication list given to you today.  *If you need a refill on your cardiac medications before your next appointment, please call your pharmacy*  Lab Work: None Ordered If you have labs (blood work) drawn today and your tests are completely normal, you will receive your results only by: Marland Kitchen MyChart Message (if you have MyChart) OR . A paper copy in the mail If you have any lab test that is abnormal or we need to change your treatment, we will call you to review the results.  Testing/Procedures: Your physician has requested that you have an echocardiogram. Echocardiography is a painless test that uses sound waves to create images of your heart. It provides your doctor with information about the size and shape of your heart and how well your heart's chambers and valves are working. This procedure takes approximately one hour. There are no restrictions for this procedure.  Your physician has requested that you have an exercise tolerance test. For further information please visit [email or web address]. Please also follow instruction sheet, as given. DO NOT Take Toprol (Metoprolol) for 2 days prior to your test Do not eat, drink, or use tobacco products four hours before your test. Dress in clothes and shoes that you can exercise in.   Your Pre-procedure COVID-19 Testing will be done on _______at [facility] at [id number], [place], [place] [address]. Once you arrive at the testing site, stay in the right hand lane, go under the building overhang not the tent. If you are tested under the tent your results may not be back before your procedure. Please be on time for your appointment.  After your swab you will be  given a mask to wear and instructed to go home and quarantine/no visitors until after your procedure. If you test positive you will be notified and your procedure will be  cancelled.    Follow-Up: At [facility], you and your health needs are our priority.  As part of our continuing mission to provide you with exceptional heart care, we have created designated Provider Care Teams.  These Care Teams include your primary Cardiologist (physician) and Advanced Practice Providers (APPs -  Physician Assistants and Nurse Practitioners) who all work together to provide you with the care you need, when you need it.  Your next appointment:   3 month(s)  The format for your next appointment:   In Person  Provider:   Mertie Moores, MD       Signed, Mertie Moores, MD  05/27/2019 8:38 PM    [place]

## 2019-06-04 ENCOUNTER — Other Ambulatory Visit (HOSPITAL_COMMUNITY)
Admission: RE | Admit: 2019-06-04 | Discharge: 2019-06-04 | Disposition: A | Discharge status: Home / Self Care | Payer: PPO | Source: Ambulatory Visit | Attending: Cardiovascular Disease | Admitting: Cardiovascular Disease

## 2019-06-04 DIAGNOSIS — Z01812 Encounter for preprocedural laboratory examination: Secondary | ICD-10-CM | POA: Insufficient documentation

## 2019-06-04 DIAGNOSIS — Z20822 Contact with and (suspected) exposure to covid-19: Secondary | ICD-10-CM | POA: Insufficient documentation

## 2019-06-04 LAB — SARS CORONAVIRUS 2 (TAT 6-24 HRS): SARS Coronavirus 2: NEGATIVE

## 2019-06-08 ENCOUNTER — Telehealth: Payer: Self-pay | Admitting: Nurse Practitioner

## 2019-06-08 ENCOUNTER — Other Ambulatory Visit: Payer: Self-pay

## 2019-06-08 ENCOUNTER — Ambulatory Visit (HOSPITAL_COMMUNITY): Payer: PPO | Attending: Cardiology

## 2019-06-08 ENCOUNTER — Other Ambulatory Visit: Payer: Self-pay | Admitting: Cardiovascular Disease

## 2019-06-08 ENCOUNTER — Ambulatory Visit (INDEPENDENT_AMBULATORY_CARE_PROVIDER_SITE_OTHER): Payer: PPO

## 2019-06-08 DIAGNOSIS — R0602 Shortness of breath: Secondary | ICD-10-CM

## 2019-06-08 LAB — EXERCISE TOLERANCE TEST
Estimated workload: 7 METS
Exercise duration (min): 5 min
Exercise duration (sec): 27 s
MPHR: 150 {beats}/min
Peak HR: 148 {beats}/min
Percent HR: 98 %
RPE: 15
Rest HR: 74 {beats}/min

## 2019-06-08 MED ORDER — CARVEDILOL 6.25 MG PO TABS: 6.2500 mg | ORAL_TABLET | Freq: Two times a day (BID) | ORAL | 11 refills | Status: DC

## 2019-06-08 MED FILL — CARVEDILOL 6.25 MG TABLET: 6.25 | 30 days supply | Qty: 60 | Fill #0

## 2019-06-08 NOTE — Telephone Encounter (Signed)
Results reviewed with patient who verbalized understanding and agreement to start carvedilol 6.25 mg twice daily. Advised her to call back with questions or concerns prior to follow-up in April. She agrees to monitor BP and report any concerns. I also reviewed her echo results with her. Questions were answered to her satisfaction. She thanked me for the call.

## 2019-06-08 NOTE — Telephone Encounter (Signed)
-----   Message from Thayer Headings, MD sent at 06/08/2019  5:20 PM EST ----- No evidence of ischemia.   Her oxygen sats were good.  She did have a hypertensive response to exercise which may be causing her shortness of breath with exertion.   Lets start Coreg 6.25 BID and see how she does with that.

## 2019-07-01 DIAGNOSIS — E7800 Pure hypercholesterolemia, unspecified: Secondary | ICD-10-CM | POA: Diagnosis not present

## 2019-07-01 DIAGNOSIS — M4722 Other spondylosis with radiculopathy, cervical region: Secondary | ICD-10-CM | POA: Diagnosis not present

## 2019-07-01 DIAGNOSIS — M810 Age-related osteoporosis without current pathological fracture: Secondary | ICD-10-CM | POA: Diagnosis not present

## 2019-07-05 MED FILL — CARVEDILOL 6.25 MG TABLET: 6.25 | 30 days supply | Qty: 60 | Fill #1

## 2019-08-05 MED FILL — CARVEDILOL 6.25 MG TABLET: 6.25 | 30 days supply | Qty: 60 | Fill #2

## 2019-08-15 MED FILL — FLUTICASONE PROP 50 MCG SPR: 50 | 60 days supply | Qty: 16 | Fill #1

## 2019-08-15 MED FILL — IBANDRONATE NA 150 MG TAB: 150 | 84 days supply | Qty: 3 | Fill #1

## 2019-08-17 DIAGNOSIS — M170 Bilateral primary osteoarthritis of knee: Secondary | ICD-10-CM | POA: Diagnosis not present

## 2019-08-17 DIAGNOSIS — M25561 Pain in right knee: Secondary | ICD-10-CM | POA: Diagnosis not present

## 2019-08-17 DIAGNOSIS — M25562 Pain in left knee: Secondary | ICD-10-CM | POA: Diagnosis not present

## 2019-08-19 ENCOUNTER — Other Ambulatory Visit: Payer: Self-pay

## 2019-08-19 ENCOUNTER — Encounter: Payer: Self-pay | Admitting: Cardiovascular Disease

## 2019-08-19 ENCOUNTER — Ambulatory Visit: Payer: PPO | Admitting: Cardiovascular Disease

## 2019-08-19 VITALS — BP 126/62 | HR 60 | Ht 66.0 in | Wt 113.2 lb

## 2019-08-19 DIAGNOSIS — R002 Palpitations: Secondary | ICD-10-CM | POA: Diagnosis not present

## 2019-08-19 DIAGNOSIS — R0602 Shortness of breath: Secondary | ICD-10-CM

## 2019-08-19 NOTE — Patient Instructions (Signed)
Medication Instructions:  Your physician recommends that you continue on your current medications as directed. Please refer to the Current Medication list given to you today.  *If you need a refill on your cardiac medications before your next appointment, please call your pharmacy*   Lab Work: TODAY  - TSH If you have labs (blood work) drawn today and your tests are completely normal, you will receive your results only by: Marland Kitchen MyChart Message (if you have MyChart) OR . A paper copy in the mail If you have any lab test that is abnormal or we need to change your treatment, we will call you to review the results.   Testing/Procedures: None Ordered   Follow-Up: At [facility], you and your health needs are our priority.  As part of our continuing mission to provide you with exceptional heart care, we have created designated Provider Care Teams.  These Care Teams include your primary Cardiologist (physician) and Advanced Practice Providers (APPs -  Physician Assistants and Nurse Practitioners) who all work together to provide you with the care you need, when you need it.   Your next appointment:   1 year(s)  The format for your next appointment:   In Person  Provider:   You may see Mertie Moores, MD or one of the following Advanced Practice Providers on your designated Care Team:    Richardson Dopp, PA-C  [place], [place]  Daune Perch, [place]

## 2019-08-19 NOTE — Progress Notes (Signed)
Cardiology Office Note:    Date:  08/19/2019   ID:  Sandra Park, [place] 1951/03/30, MRN [id number]  PCP:  Kathyrn Lass, MD  Cardiologist:  Marvette Schamp  Electrophysiologist:  None   Referring MD: Kathyrn Lass, MD   Chief Complaint  Patient presents with   Shortness of Breath    History of Present Illness:    Sandra Park is a 69 y.o. female with a hx of palpitations in the past.  We were asked to see her today by Dr. Leonides Schanz for further evaluation of these palpitations.  She has had palpitations for years. Wore a monitor for several weeks back in 2017  In Dec. Developed more palpitations Associated with a brief episode of lightheadedness.  Felt fatigued afterwards.  Has an occasional  "emptyness"  in the center of her chest  Works out regularly .   [place] quickly , recovers quickly   Has had hyperlipidemia. Coronary calcium score in Sept. 2019 was 0  Random episodes of chest pressure.   Not associated with exercise  May last for 5-10 minute Has been walking for year [place] up on a farm .   Now raises sweet potatoes.  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  Had pneumonia a year ago .  Labs from her primary medical doctor's office in December, 2020 reveals glucose of 80.  Creatinine is 0.66.  Sodium is 140.  Potassium is 5.0.  White blood cell count is 6.1.  Hemoglobin is 13.2.  TSH is 1.23.  She is reportedly had an x-ray at [facility] although I cannot find that report on the chart.  He had supposedly so it showed some nodules that are being followed by her primary medical doctor.  August 19, 2019:  [name] is seen today for follow-up of her episodes of shortness of breath. Had a stress test on June 08, 2019.  She had no ST or T wave changes.  She did have a hypertensive response to exercise.  Her pulse oximetry was normal throughout the exercise test. Echocardiogram reveals hyperdynamic left  ventricular function.  She has grade 1 diastolic dysfunction.  She was started on carvedilol in response to her hypertensive response to exercise. Thinks her DOE is improved.   Takes her pulse ox while walking.   O2 sats are normal .  Tolerating the coreg well   Past Medical History:  Diagnosis Date   Arthritis    GERD (gastroesophageal reflux disease)    History of colitis 09/06/2014   History of kidney stones    Hypoproteinemia (HCC)    Nasal congestion    Osteopenia    Osteoporosis    Pure hypercholesterolemia    Renal cyst    SVT (supraventricular tachycardia) ([place])     Past Surgical History:  Procedure Laterality Date   COLONOSCOPY  2006, 2016   normal   LAPAROSCOPY Left 1989,& 90's   salpingoopherectomy cyst x2    Current Medications: Current Meds  Medication Sig   aspirin EC 81 MG tablet Take 81 mg by mouth daily.   Calcium Carbonate (CALCIUM 600 PO) Take 1 tablet by mouth daily.    carvedilol (COREG) 6.25 MG tablet Take 1 tablet (6.25 mg total) by mouth 2 (two) times daily.   Cholecalciferol (VITAMIN D3) 1000 units CAPS Take 2,000 Units by mouth daily.   Cyanocobalamin (VITAMIN B12) 500 MCG TABS Take  1 tablet by mouth daily.   famotidine (PEPCID) 20 MG tablet Take 20 mg by mouth daily.   fluticasone (FLONASE) 50 MCG/ACT nasal spray Place 2 sprays into both nostrils daily.   ibandronate (BONIVA) 150 MG tablet Take 150 mg by mouth every 30 (thirty) days.   ibuprofen (ADVIL,MOTRIN) 200 MG tablet Take 800 mg by mouth every 6 (six) hours as needed for moderate pain.   Multiple Vitamin (MULTIVITAMIN) tablet Take 1 tablet by mouth daily.   naproxen sodium (ALEVE) 220 MG tablet Take 220 mg by mouth daily as needed.   Plant Sterols and Stanols (CHOLESTOFF PLUS) 450 MG CAPS Take 1 capsule by mouth daily.   PROAIR HFA 108 (90 Base) MCG/ACT inhaler Inhale 2 puffs into the lungs as needed.   Probiotic Product (PROBIOTIC DAILY PO) Take 1 tablet by mouth  daily.   Turmeric (QC TUMERIC COMPLEX) 500 MG CAPS Take 1,000 mg by mouth daily.   vitamin C (ASCORBIC ACID) 500 MG tablet Take 500 mg by mouth daily.     Allergies:   Patient has no known allergies.   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   3-4 caffeine drinks/day         Social Determinants of Health   Financial Resource Strain:    Difficulty of Paying Living Expenses:   Food Insecurity:    Worried About Charity fundraiser in the Last Year:    Arboriculturist in the Last Year:   Transportation Needs:    Film/video editor (Medical):    Lack of Transportation (Non-Medical):   Physical Activity:    Days of Exercise per Week:    Minutes of Exercise per Session:   Stress:    Feeling of Stress :   Social Connections:    Frequency of Communication with Friends and Family:    Frequency of Social Gatherings with Friends and Family:    Attends Religious Services:    Active Member of Clubs or Organizations:    Attends Music therapist:    Marital Status:      Family History: The patient's family history includes Arthritis in her father and mother; Atrial fibrillation in her father and mother; Clotting disorder in her father; Heart attack in her father; Heart disease in her maternal grandfather; Hypertension in her father, maternal grandfather, and mother; Pneumonia in her mother; Suicidality in her paternal grandmother. There is no history of Colon  cancer.  ROS:   Please see the history of present illness.     All other systems reviewed and are negative.  EKGs/Labs/Other Studies Reviewed:    The following studies were reviewed today:   EKG:  Jan. 28, 2021:  NSR at 77,  Occasional PVCs   Recent Labs: No results found for requested labs within last 8760 hours.  Recent Lipid Panel No results found for:  CHOL, TRIG, HDL, CHOLHDL, VLDL, LDLCALC, LDLDIRECT  Physical Exam: Blood pressure 126/62, pulse 60, height 5\' 6"  (1.676 m), weight 113 lb 4 oz (51.4 kg), SpO2 97 %.  GEN:  Well nourished, well developed in no acute distress HEENT: Normal NECK: No JVD; No carotid bruits LYMPHATICS: No lymphadenopathy CARDIAC: RRR , no murmurs, rubs, gallops RESPIRATORY:  Clear to auscultation without rales, wheezing or rhonchi  ABDOMEN: Soft, non-tender, non-distended MUSCULOSKELETAL:  No edema; No deformity  SKIN: Warm and dry NEUROLOGIC:  Alert and oriented x 3   ASSESSMENT:    1. Shortness of breath   2. Palpitations    PLAN:    In order of problems listed above:  1.  Shortness of breath with exertion: Leonia Reader originally presented with shortness of breath with exertion.  She did well on a treadmill test.  She was found to have a hypertensive response to exercise.  Echocardiogram revealed hyperdynamic left ventricle.  We started her on carvedilol 6.25 mg twice a day.  This seems to have helped.  We will also check a TSH today.  We will continue with her current medications.  I will plan on seeing her again in 1 year.  We will anticipate repeating the echocardiogram and possibly repeating the stress test in several years.  I encouraged her to call us if she has any additional problems.    Medication Adjustments/Labs and Tests Ordered: Current medicines are reviewed at length with the patient today.  Concerns regarding medicines are outlined above.  Orders Placed This Encounter  Procedures   TSH   No orders of the defined  types were placed in this encounter.   Patient Instructions  Medication Instructions:  Your physician recommends that you continue on your current medications as directed. Please refer to the Current Medication list given to you today.  *If you need a refill on your cardiac medications before your next appointment, please call your pharmacy*   Lab Work: TODAY  - TSH If you have labs (blood work) drawn today and your tests are completely normal, you will receive your results only by:  [place] (if you have MyChart) OR  A paper copy in the mail If you have any lab test that is abnormal or we need to change your treatment, we will call you to review the results.   Testing/Procedures: None Ordered   Follow-Up: At [facility], you and your health needs are our priority.  As part of our continuing mission to provide you with exceptional heart care, we have created designated Provider Care Teams.  These Care Teams include your primary Cardiologist (physician) and Advanced Practice Providers (APPs -  Physician Assistants and Nurse Practitioners) who all work together to provide you with the care you need, when you need it.   Your next appointment:   1 year(s)  The format for your next appointment:   In Person  Provider:   You may see Mertie Moores, MD or one of the following Advanced Practice Providers on your designated Care Team:    Richardson Dopp, PA-C  [place], [place]  Daune Perch, [place]         Signed, Mertie Moores, MD  08/19/2019 2:41 PM    [place]

## 2019-08-20 LAB — TSH: TSH: 1.33 u[IU]/mL (ref 0.450–4.500)

## 2019-09-06 MED FILL — CARVEDILOL 6.25 MG TABLET: 6.25 | 30 days supply | Qty: 60 | Fill #3

## 2019-09-07 DIAGNOSIS — M1712 Unilateral primary osteoarthritis, left knee: Secondary | ICD-10-CM | POA: Diagnosis not present

## 2019-09-07 DIAGNOSIS — M170 Bilateral primary osteoarthritis of knee: Secondary | ICD-10-CM | POA: Diagnosis not present

## 2019-09-07 DIAGNOSIS — M1711 Unilateral primary osteoarthritis, right knee: Secondary | ICD-10-CM | POA: Diagnosis not present

## 2019-09-14 DIAGNOSIS — M170 Bilateral primary osteoarthritis of knee: Secondary | ICD-10-CM | POA: Diagnosis not present

## 2019-09-14 DIAGNOSIS — M1712 Unilateral primary osteoarthritis, left knee: Secondary | ICD-10-CM | POA: Diagnosis not present

## 2019-09-14 DIAGNOSIS — M1711 Unilateral primary osteoarthritis, right knee: Secondary | ICD-10-CM | POA: Diagnosis not present

## 2019-09-16 DIAGNOSIS — M4722 Other spondylosis with radiculopathy, cervical region: Secondary | ICD-10-CM | POA: Diagnosis not present

## 2019-09-16 DIAGNOSIS — E7800 Pure hypercholesterolemia, unspecified: Secondary | ICD-10-CM | POA: Diagnosis not present

## 2019-09-16 DIAGNOSIS — M810 Age-related osteoporosis without current pathological fracture: Secondary | ICD-10-CM | POA: Diagnosis not present

## 2019-09-21 DIAGNOSIS — M25562 Pain in left knee: Secondary | ICD-10-CM | POA: Diagnosis not present

## 2019-09-21 DIAGNOSIS — M170 Bilateral primary osteoarthritis of knee: Secondary | ICD-10-CM | POA: Diagnosis not present

## 2019-09-30 ENCOUNTER — Other Ambulatory Visit: Payer: Self-pay | Admitting: Family Medicine

## 2019-09-30 DIAGNOSIS — Z1231 Encounter for screening mammogram for malignant neoplasm of breast: Secondary | ICD-10-CM

## 2019-10-13 ENCOUNTER — Other Ambulatory Visit: Payer: Self-pay

## 2019-10-13 ENCOUNTER — Ambulatory Visit
Admission: RE | Admit: 2019-10-13 | Discharge: 2019-10-13 | Disposition: A | Discharge status: Home / Self Care | Payer: PPO | Source: Ambulatory Visit | Attending: Family Medicine | Admitting: Family Medicine

## 2019-10-13 ENCOUNTER — Ambulatory Visit: Payer: PPO

## 2019-10-13 DIAGNOSIS — Z1231 Encounter for screening mammogram for malignant neoplasm of breast: Secondary | ICD-10-CM | POA: Diagnosis not present

## 2019-10-18 MED FILL — FLUTICASONE PROP 50 MCG SPR: 50 | 60 days supply | Qty: 16 | Fill #2

## 2019-11-08 MED FILL — CARVEDILOL 6.25 MG TABLET: 6.25 | 30 days supply | Qty: 60 | Fill #5

## 2019-11-29 MED FILL — IBANDRONATE NA 150 MG TAB: 150 | 84 days supply | Qty: 3 | Fill #2

## 2019-12-08 MED FILL — CARVEDILOL 6.25 MG TABLET: 6.25 | 30 days supply | Qty: 60 | Fill #6

## 2019-12-21 DIAGNOSIS — M810 Age-related osteoporosis without current pathological fracture: Secondary | ICD-10-CM | POA: Diagnosis not present

## 2019-12-21 DIAGNOSIS — Z79899 Other long term (current) drug therapy: Secondary | ICD-10-CM | POA: Diagnosis not present

## 2019-12-21 DIAGNOSIS — Z0000 Encounter for general adult medical examination without abnormal findings: Secondary | ICD-10-CM | POA: Diagnosis not present

## 2019-12-21 DIAGNOSIS — Z681 Body mass index (BMI) 19 or less, adult: Secondary | ICD-10-CM | POA: Diagnosis not present

## 2019-12-21 DIAGNOSIS — M4722 Other spondylosis with radiculopathy, cervical region: Secondary | ICD-10-CM | POA: Diagnosis not present

## 2019-12-22 DIAGNOSIS — H2513 Age-related nuclear cataract, bilateral: Secondary | ICD-10-CM | POA: Diagnosis not present

## 2019-12-22 DIAGNOSIS — H1789 Other corneal scars and opacities: Secondary | ICD-10-CM | POA: Diagnosis not present

## 2019-12-22 DIAGNOSIS — H524 Presbyopia: Secondary | ICD-10-CM | POA: Diagnosis not present

## 2019-12-23 ENCOUNTER — Other Ambulatory Visit: Payer: Self-pay | Admitting: Family Medicine

## 2019-12-23 DIAGNOSIS — M810 Age-related osteoporosis without current pathological fracture: Secondary | ICD-10-CM

## 2019-12-30 ENCOUNTER — Emergency Department (HOSPITAL_COMMUNITY): Payer: PPO

## 2019-12-30 ENCOUNTER — Encounter (HOSPITAL_COMMUNITY): Payer: Self-pay | Admitting: *Deleted

## 2019-12-30 ENCOUNTER — Other Ambulatory Visit: Payer: Self-pay

## 2019-12-30 ENCOUNTER — Observation Stay (HOSPITAL_COMMUNITY)
Admission: EM | Admit: 2019-12-30 | Discharge: 2019-12-31 | Disposition: A | Discharge status: Home / Self Care | Payer: PPO | Attending: Emergency Medicine | Admitting: Emergency Medicine

## 2019-12-30 DIAGNOSIS — R079 Chest pain, unspecified: Secondary | ICD-10-CM

## 2019-12-30 DIAGNOSIS — R519 Headache, unspecified: Secondary | ICD-10-CM | POA: Insufficient documentation

## 2019-12-30 DIAGNOSIS — R0602 Shortness of breath: Secondary | ICD-10-CM | POA: Diagnosis not present

## 2019-12-30 DIAGNOSIS — Z20822 Contact with and (suspected) exposure to covid-19: Secondary | ICD-10-CM | POA: Insufficient documentation

## 2019-12-30 DIAGNOSIS — R55 Syncope and collapse: Secondary | ICD-10-CM

## 2019-12-30 DIAGNOSIS — I10 Essential (primary) hypertension: Secondary | ICD-10-CM | POA: Diagnosis not present

## 2019-12-30 DIAGNOSIS — R609 Edema, unspecified: Secondary | ICD-10-CM | POA: Diagnosis not present

## 2019-12-30 DIAGNOSIS — R778 Other specified abnormalities of plasma proteins: Secondary | ICD-10-CM | POA: Diagnosis not present

## 2019-12-30 DIAGNOSIS — R7989 Other specified abnormal findings of blood chemistry: Secondary | ICD-10-CM | POA: Diagnosis not present

## 2019-12-30 DIAGNOSIS — I249 Acute ischemic heart disease, unspecified: Principal | ICD-10-CM | POA: Insufficient documentation

## 2019-12-30 DIAGNOSIS — R918 Other nonspecific abnormal finding of lung field: Secondary | ICD-10-CM | POA: Diagnosis not present

## 2019-12-30 DIAGNOSIS — I5030 Unspecified diastolic (congestive) heart failure: Secondary | ICD-10-CM | POA: Diagnosis not present

## 2019-12-30 DIAGNOSIS — R42 Dizziness and giddiness: Secondary | ICD-10-CM

## 2019-12-30 DIAGNOSIS — R072 Precordial pain: Secondary | ICD-10-CM | POA: Diagnosis not present

## 2019-12-30 DIAGNOSIS — R000 Tachycardia, unspecified: Secondary | ICD-10-CM | POA: Diagnosis not present

## 2019-12-30 LAB — CBC
HCT: 41.2 % (ref 36.0–46.0)
Hemoglobin: 13.1 g/dL (ref 12.0–15.0)
MCH: 30.5 pg (ref 26.0–34.0)
MCHC: 31.8 g/dL (ref 30.0–36.0)
MCV: 95.8 fL (ref 80.0–100.0)
Platelets: 228 10*3/uL (ref 150–400)
RBC: 4.3 MIL/uL (ref 3.87–5.11)
RDW: 12.6 % (ref 11.5–15.5)
WBC: 4.6 10*3/uL (ref 4.0–10.5)
nRBC: 0 % (ref 0.0–0.2)

## 2019-12-30 LAB — BASIC METABOLIC PANEL
Anion gap: 10 (ref 5–15)
BUN: 20 mg/dL (ref 8–23)
CO2: 26 mmol/L (ref 22–32)
Calcium: 10.3 mg/dL (ref 8.9–10.3)
Chloride: 102 mmol/L (ref 98–111)
Creatinine, Ser: 0.8 mg/dL (ref 0.44–1.00)
GFR calc Af Amer: >60 mL/min (ref >60)
GFR calc non Af Amer: >60 mL/min (ref >60)
Glucose, Bld: 97 mg/dL (ref 70–99)
Potassium: 4.6 mmol/L (ref 3.5–5.1)
Sodium: 138 mmol/L (ref 135–145)

## 2019-12-30 LAB — URINALYSIS, ROUTINE W REFLEX MICROSCOPIC
Bilirubin Urine: NEGATIVE
Glucose, UA: NEGATIVE mg/dL
Hgb urine dipstick: NEGATIVE
Ketones, ur: NEGATIVE mg/dL
Leukocytes,Ua: NEGATIVE
Nitrite: NEGATIVE
Protein, ur: NEGATIVE mg/dL
Specific Gravity, Urine: 1.009 (ref 1.005–1.030)
pH: 7 (ref 5.0–8.0)

## 2019-12-30 LAB — CK: Total CK: 45 U/L (ref 38–234)

## 2019-12-30 LAB — TROPONIN I (HIGH SENSITIVITY)
Troponin I (High Sensitivity): 23 ng/L — ABNORMAL HIGH (ref <18)
Troponin I (High Sensitivity): 185 ng/L (ref <18)
Troponin I (High Sensitivity): 220 ng/L (ref <18)

## 2019-12-30 MED ORDER — METOPROLOL TARTRATE 5 MG/5ML IV SOLN: 5.0000 mg | INTRAVENOUS | Status: DC | PRN

## 2019-12-30 MED ORDER — HEPARIN (PORCINE) 25000 UT/250ML-% IV SOLN: 600.0000 [IU]/h | INTRAVENOUS | Status: DC

## 2019-12-30 MED ORDER — NITROGLYCERIN 0.4 MG SL SUBL
0.8000 mg | SUBLINGUAL_TABLET | Freq: Once | SUBLINGUAL | Status: AC
  Administered 2019-12-30: 0.8 mg via SUBLINGUAL

## 2019-12-30 MED ORDER — RISAQUAD PO CAPS
1.0000 | ORAL_CAPSULE | Freq: Every day | ORAL | Status: DC
  Administered 2019-12-31: 1 via ORAL
  Filled 2019-12-30: qty 1

## 2019-12-30 MED ORDER — NITROGLYCERIN 0.4 MG SL SUBL
SUBLINGUAL_TABLET | SUBLINGUAL | Status: AC
  Filled 2019-12-30: qty 2

## 2019-12-30 MED ORDER — ALBUTEROL SULFATE (2.5 MG/3ML) 0.083% IN NEBU: 3.0000 mL | INHALATION_SOLUTION | Freq: Every day | RESPIRATORY_TRACT | Status: DC | PRN

## 2019-12-30 MED ORDER — ACETAMINOPHEN 650 MG RE SUPP: 650.0000 mg | Freq: Four times a day (QID) | RECTAL | Status: DC | PRN

## 2019-12-30 MED ORDER — ACETAMINOPHEN 500 MG PO TABS
1000.0000 mg | ORAL_TABLET | Freq: Every day | ORAL | Status: DC
  Administered 2019-12-30: 1000 mg via ORAL
  Filled 2019-12-30: qty 2

## 2019-12-30 MED ORDER — HEPARIN BOLUS VIA INFUSION
3050.0000 [IU] | Freq: Once | INTRAVENOUS | Status: DC
  Filled 2019-12-30: qty 3050

## 2019-12-30 MED ORDER — CYANOCOBALAMIN 500 MCG PO TABS
500.0000 ug | ORAL_TABLET | Freq: Every day | ORAL | Status: DC
  Filled 2019-12-30: qty 1

## 2019-12-30 MED ORDER — CARVEDILOL 6.25 MG PO TABS
6.2500 mg | ORAL_TABLET | Freq: Two times a day (BID) | ORAL | Status: DC
  Administered 2019-12-30 – 2019-12-31 (×2): 6.25 mg via ORAL
  Filled 2019-12-30 (×2): qty 1

## 2019-12-30 MED ORDER — FLUTICASONE PROPIONATE 50 MCG/ACT NA SUSP
2.0000 | Freq: Every day | NASAL | Status: DC
  Filled 2019-12-30: qty 16

## 2019-12-30 MED ORDER — ACETAMINOPHEN 325 MG PO TABS: 650.0000 mg | ORAL_TABLET | Freq: Four times a day (QID) | ORAL | Status: DC | PRN

## 2019-12-30 MED ORDER — IOHEXOL 350 MG/ML SOLN
80.0000 mL | Freq: Once | INTRAVENOUS | Status: AC | PRN
  Administered 2019-12-30: 80 mL via INTRAVENOUS

## 2019-12-30 MED ORDER — METOPROLOL TARTRATE 25 MG PO TABS
50.0000 mg | ORAL_TABLET | ORAL | Status: DC
  Filled 2019-12-30: qty 2

## 2019-12-30 MED ORDER — ENOXAPARIN SODIUM 40 MG/0.4ML ~~LOC~~ SOLN
40.0000 mg | SUBCUTANEOUS | Status: DC
  Administered 2019-12-30: 40 mg via SUBCUTANEOUS
  Filled 2019-12-30: qty 0.4

## 2019-12-30 MED ORDER — FAMOTIDINE 20 MG PO TABS
20.0000 mg | ORAL_TABLET | Freq: Every day | ORAL | Status: DC
  Administered 2019-12-31: 20 mg via ORAL
  Filled 2019-12-30: qty 1

## 2019-12-30 MED ORDER — ASPIRIN EC 81 MG PO TBEC
81.0000 mg | DELAYED_RELEASE_TABLET | Freq: Every day | ORAL | Status: DC
  Administered 2019-12-30: 81 mg via ORAL
  Filled 2019-12-30 (×2): qty 1

## 2019-12-30 MED ORDER — METOPROLOL TARTRATE 5 MG/5ML IV SOLN
INTRAVENOUS | Status: AC
  Administered 2019-12-30: 5 mg via INTRAVENOUS
  Filled 2019-12-30: qty 5

## 2019-12-30 NOTE — Progress Notes (Signed)
ANTICOAGULATION CONSULT NOTE - Initial Consult  Pharmacy Consult for IV heparin  Indication: chest pain/ACS  No Known Allergies  Patient Measurements: Height: 5' 5.5" (166.4 cm) Weight: 50.8 kg (112 lb) IBW/kg (Calculated) : 58.15 Heparin Dosing Weight: 50.8kg  Vital Signs: Temp: 98.8 F (37.1 C) (09/02 0811) Temp Source: Oral (09/02 0811) BP: 157/120 (09/02 1530) Pulse Rate: 73 (09/02 1530)  Labs: Recent Labs    12/30/19 0744 12/30/19 0940  HGB 13.1  --   HCT 41.2  --   PLT 228  --   CREATININE 0.80  --   TROPONINIHS 23* 185*    Estimated Creatinine Clearance: 52.5 mL/min (by C-G formula based on SCr of 0.8 mg/dL).   Medical History: Past Medical History:  Diagnosis Date  . Arthritis   . GERD (gastroesophageal reflux disease)   . History of colitis 09/06/2014  . History of kidney stones   . Hypoproteinemia ([place])   . Osteopenia   . Osteoporosis   . Pure hypercholesterolemia   . Renal cyst   . SVT (supraventricular tachycardia) (HCC)     Assessment: 69yoF presenting with lightheadedness and chest pressure w/ slight SOB. SBP at home was ~200. Troponins have increased since admission, now 185. EKG without acute changes.  CBC WNL, SCr <1.   Goal of Therapy:  Heparin level 0.3-0.7 units/ml Monitor platelets by anticoagulation protocol: Yes   Plan:  Give 3,050 units bolus x 1 Start heparin infusion at 600 units/hr Check anti-Xa level in 8 hours and daily while on heparin Continue to monitor H&H and platelets Follow ischemic workup and plans for [place], PharmD PGY1 Acute Care Pharmacy Resident Please refer to [facility] for unit-specific pharmacist

## 2019-12-30 NOTE — ED Triage Notes (Addendum)
Pt reports waking up this am and noted swelling/bruising to left ankle, no injury noted. Pt then began feeling lightheaded and has a "discomfort" in her chest. No acute distress is noted on arrival. Also reports recent right side headaches that are intermittent, episodes lasting less than 1 min. Ems gave ASA pta.

## 2019-12-30 NOTE — ED Provider Notes (Signed)
[facility] Provider Note   CSN: [id number] Arrival date & time: 12/30/19  0730     History Chief Complaint  Patient presents with  . Loss of Consciousness  . Chest Pain    [place] is a 69 y.o. female.  HPI Patient had an episode of lightheadedness this morning.  She had gotten up and had a usual breakfast.  Blood sugar was normal on EMS arrival.  She was getting ready to go to an exercise class and putting her hair up in a pony tail when she suddenly felt lightheaded and as if she might pass out.  She was in a standing position.  Patient was able to sit down and call 911.  She reports she had some quality of chest pressure but was not painful.  She felt slightly short of breath.  No vertiginous quality.  No radiation of pain.  Symptoms resolved spontaneously by the time EMS arrived.  She reports at that time she still has some mild nausea but symptoms were nearly resolved.  She is asymptomatic now.  Patient typically exercises without chest pain or shortness of breath.  She has not been sick recently.  No recent fevers, chills, cough.  Incidentally, patient noted a little bit of swelling discomfort in her left ankle this morning.  She checked it and thought she had ruptured a small blood vessel there was a slight discoloration.  She applied ice earlier in the day.  She no longer has any discomfort or symptoms associated with this.  Patient had episode of lightheadedness similar in December.  At that time, she had diagnostic evaluation by cardiology.  Patient had stress test and echocardiogram.  Reportedly, no significant abnormal findings identified.  He did have mild diastolic dysfunction.  Occasionally over the past several weeks he has noticed a very brief, lancinating pain that occurs over the right forehead.  It lasts for a few seconds and then is gone.  No associated symptoms.    Past Medical History:  Diagnosis Date  . Arthritis   .  GERD (gastroesophageal reflux disease)   . History of colitis 09/06/2014  . History of kidney stones   . Hypoproteinemia ([place])   . Nasal congestion   . Osteopenia   . Osteoporosis   . Pure hypercholesterolemia   . Renal cyst   . SVT (supraventricular tachycardia) [facility])     Patient Active Problem List   Diagnosis Date Noted  . Acid reflux 12/12/2014  . Pain in joint involving lower leg 12/12/2014  . OP (osteoporosis) 12/12/2014  . FOM (frequency of micturition) 12/12/2014  . Decreased body weight 12/12/2014  . Hypoproteinemia ([place]) 11/29/2013  . Edema 11/29/2013    Past Surgical History:  Procedure Laterality Date  . COLONOSCOPY  2006, 2016   normal  . LAPAROSCOPY Left 1989,& 90's   salpingoopherectomy cyst x2     OB History   No obstetric history on file.     Family History  Problem Relation Age of Onset  . Atrial fibrillation Mother   . Hypertension Mother   . Arthritis Mother   . Pneumonia Mother   . Atrial fibrillation Father   . Hypertension Father   . Arthritis Father   . Heart attack Father   . Clotting disorder Father        DVT's  . Suicidality Paternal Grandmother   . Hypertension Maternal Grandfather   . Heart disease Maternal Grandfather   . Colon cancer Neg  Hx     Social History   Tobacco Use  . Smoking status: Never Smoker  . Smokeless tobacco: Never Used  Substance Use Topics  . Alcohol use: No  . Drug use: No    Home Medications Prior to Admission medications   Medication Sig Start Date End Date Taking? Authorizing Provider  aspirin EC 81 MG tablet Take 81 mg by mouth daily.    [provider]  Calcium Carbonate (CALCIUM 600 PO) Take 1 tablet by mouth daily.     [provider]  carvedilol (COREG) 6.25 MG tablet Take 1 tablet (6.25 mg total) by mouth 2 (two) times daily. 06/08/19   Nahser, Wonda Cheng, MD  Cholecalciferol (VITAMIN D3) 1000 units CAPS Take 2,000 Units by mouth daily.    [provider]   Cyanocobalamin (VITAMIN B12) 500 MCG TABS Take 1 tablet by mouth daily.    [provider]  famotidine (PEPCID) 20 MG tablet Take 20 mg by mouth daily.    [provider]  fluticasone (FLONASE) 50 MCG/ACT nasal spray Place 2 sprays into both nostrils daily. 04/06/19   [provider]  ibandronate (BONIVA) 150 MG tablet Take 150 mg by mouth every 30 (thirty) days. 04/20/19   [provider]  ibuprofen (ADVIL,MOTRIN) 200 MG tablet Take 800 mg by mouth every 6 (six) hours as needed for moderate pain.    [provider]  Multiple Vitamin (MULTIVITAMIN) tablet Take 1 tablet by mouth daily.    [provider]  naproxen sodium (ALEVE) 220 MG tablet Take 220 mg by mouth daily as needed.    [provider]  Plant Sterols and Stanols (CHOLESTOFF PLUS) 450 MG CAPS Take 1 capsule by mouth daily.    [provider]  PROAIR HFA 108 [phone] Base) MCG/ACT inhaler Inhale 2 puffs into the lungs as needed. 01/28/19   [provider]  Probiotic Product (PROBIOTIC DAILY PO) Take 1 tablet by mouth daily.    [provider]  Turmeric (QC TUMERIC COMPLEX) 500 MG CAPS Take 1,000 mg by mouth daily.    [provider]  vitamin C (ASCORBIC ACID) 500 MG tablet Take 500 mg by mouth daily.    [provider]    Allergies    Patient has no known allergies.  Review of Systems   Review of Systems 10 systems reviewed and negative except as per HPI Physical Exam Updated Vital Signs BP 125/70   Pulse 63   Temp 98.8 F (37.1 C) (Oral)   Resp 12   Ht 5' 5.5" (1.664 m)   Wt 50.8 kg   SpO2 100%   BMI 18.35 kg/m   Physical Exam Constitutional:      Appearance: She is well-developed.  HENT:     Head: Normocephalic and atraumatic.  Eyes:     Pupils: Pupils are equal, round, and reactive to light.  Cardiovascular:     Rate and Rhythm: Normal rate and regular rhythm.     Heart sounds: Normal heart sounds.  Pulmonary:      Effort: Pulmonary effort is normal.     Breath sounds: Normal breath sounds.  Abdominal:     General: Bowel sounds are normal. There is no distension.     Palpations: Abdomen is soft.     Tenderness: There is no abdominal tenderness.  Musculoskeletal:        General: Normal range of motion.     Cervical back: Neck supple.  Skin:  General: Skin is warm and dry.  Neurological:     Mental Status: She is alert and oriented to person, place, and time.     GCS: GCS eye subscore is 4. GCS verbal subscore is 5. GCS motor subscore is 6.     Coordination: Coordination normal.     ED Results / Procedures / Treatments   Labs (all labs ordered are listed, but only abnormal results are displayed) Labs Reviewed  URINALYSIS, ROUTINE W REFLEX MICROSCOPIC - Abnormal; Notable for the following components:      Result Value   Color, Urine STRAW (*)    All other components within normal limits  TROPONIN I (HIGH SENSITIVITY) - Abnormal; Notable for the following components:   Troponin I (High Sensitivity) 23 (*)    All other components within normal limits  TROPONIN I (HIGH SENSITIVITY) - Abnormal; Notable for the following components:   Troponin I (High Sensitivity) 185 (*)    All other components within normal limits  BASIC METABOLIC PANEL  CBC    EKG EKG Interpretation  Date/Time:  Thursday December 30 2019 13:26:44 EDT Ventricular Rate:  63 PR Interval:    QRS Duration: 96 QT Interval:  417 QTC Calculation: 427 R Axis:   88 Text Interpretation: Sinus rhythm Borderline right axis deviation no sig change from previous. no acute ischemic appearance Confirmed by Charlesetta Shanks [phone]) on 12/30/2019 3:12:04 PM   Radiology DG Chest 2 View  Result Date: 12/30/2019 CLINICAL DATA:  cp EXAM: CHEST - 2 VIEW COMPARISON:  Chest x-ray 01/19/2019, CT heart 01/14/2018, chest x-ray 08/29/15 FINDINGS: The heart size and mediastinal contours are within normal limits. Similar appearing  hyperinflation of bilateral lungs. Biapical trace pleural/pulmonary scarring. No focal consolidation. Pericentimeter round density overlying the left lower lobe likely represents a nipple shadow. No pulmonary edema. No pleural effusion. No pneumothorax. Persistent partially visualized levoscoliosis of the lumbar spine with compensatory dextroscoliosis of the thoracic spine. Multilevel degenerative changes of the spine. No acute displaced fracture. IMPRESSION: No active cardiopulmonary disease. Electronically Signed   By: Iven Finn M.D.   On: 12/30/2019 08:26    Procedures Procedures (including critical care time)  Medications Ordered in ED Medications - No data to display  ED Course  I have reviewed the triage vital signs and the nursing notes.  Pertinent labs & imaging results that were available during my care of the patient were reviewed by me and considered in my medical decision making (see chart for details).    MDM Rules/Calculators/A&P                          Consult: Reviewed with [employer] cardiology  Patient presents with an episode of abrupt onset of intense lightheadedness and feeling of near syncope with associated chest pressure.  Symptoms did wean spontaneously.  Patient had some residual nausea which is also since resolved.  Patient reports that her blood pressure at the time of this episode was almost 540/086 systolic that she measured at home.  Blood pressure spontaneously normalized during transport and [facility].  Patient reports her blood pressures are typically well controlled but occasionally she will get a spike in blood pressure that spontaneously improves.  She does not monitor her blood pressure frequently but is checked regularly and usually near normotensive.  Patient has had prior stress test without acute findings.  Today however with symptoms that are concerning for cardiac ischemic etiology and elevating troponin, will proceed  with cardiology  consultation.  Patient is pain-free at this time.  EKG does not show acute changes.  Patient's report of a brief lancinating headache intermittently does not sound suggestive of etiology for her symptoms.  We will proceed with CT head as this has happened several times over the past number of weeks, make sure no evidence of anomaly in event patient should need anticoagulation for ACS.  Disposition pending cardiology consultation. Final Clinical Impression(s) / ED Diagnoses Final diagnoses:  ACS (acute coronary syndrome) ([place])  Troponin level elevated    Rx / DC Orders ED Discharge Orders    None       Charlesetta Shanks, MD 12/30/19 1534

## 2019-12-30 NOTE — H&P (Signed)
History and Physical    Sandra Park MWU:[id number] DOB: 17-Nov-1950 DOA: 12/30/2019  PCP: Kathyrn Lass, MD (Confirm with patient/family/NH records and if not entered, this has to be entered at [facility] point of entry) Patient coming from: Home  I have personally briefly reviewed patient's old medical records in [place]  Chief Complaint: Chest pain  HPI: Sandra Park is a 69 y.o. female with medical history significant of HTN, GERD, osteoporosis, presented with new onset of chest pain and near syncope.  Patient woke up this morning around 630 and started to feel severe lightheadedness with blurry vision, " almost like about to pass out" she had to lie down again, soon after she started to feel pressure-like chest pain 10/10, nonradiating, associated with shortness of breath, chest pain constant, no exacerbation or relieving factors.  She checked her blood pressure at home and found her blood pressure was significantly elevated to 170s.  She called EMS, who gave her some aspirin.  By 10:00, her lightheadedness and chest pain resolved.  Denied any feeling of palpitations, no nauseous vomit no abdominal pain.  During the episode, patient also noticed a small bump on her left inner ankle, " almost looked like a blood vessel popped up" she applied ice pack to it and the bump strength within 1 to 2 hours.  She did not feel any pain denies any calf pain.  She drove back from [place] last Monday and the travel was around 3 hours. ED Course: Troponin elevated 23>186.  His usual no acute ST changes.  Cardiology consulted and ordered a coronary artery CT which showed score is 0.  CBC BMP largely normal.  And patient blood pressure became controlled and chest pain subsided.  Review of Systems: As per HPI otherwise 14 point review of systems negative.    Past Medical History:  Diagnosis Date  . Arthritis   . GERD (gastroesophageal reflux disease)   . History of colitis 09/06/2014  . History of  kidney stones   . Hypoproteinemia ([place])   . Osteopenia   . Osteoporosis   . Pure hypercholesterolemia   . Renal cyst   . SVT (supraventricular tachycardia) (HCC)     Past Surgical History:  Procedure Laterality Date  . COLONOSCOPY  2006, 2016   normal  . LAPAROSCOPY Left 1989,& 90's   salpingoopherectomy cyst x2     reports that she has never smoked. She has never used smokeless tobacco. She reports that she does not drink alcohol and does not use drugs.  No Known Allergies  Family History  Problem Relation Age of Onset  . Atrial fibrillation Mother   . Hypertension Mother   . Arthritis Mother   . Pneumonia Mother   . Atrial fibrillation Father   . Hypertension Father   . Arthritis Father   . Heart attack Father   . Clotting disorder Father        DVT's  . Suicidality Paternal Grandmother   . Hypertension Maternal Grandfather   . Heart disease Maternal Grandfather   . Colon cancer Neg Hx      Prior to Admission medications   Medication Sig Start Date End Date Taking? Authorizing Provider  acetaminophen (TYLENOL) 500 MG tablet Take 1,000 mg by mouth at bedtime.   Yes [provider]  aspirin EC 81 MG tablet Take 81 mg by mouth daily.   Yes [provider]  Calcium Carbonate (CALCIUM 600 PO) Take 1 tablet by mouth daily.  Yes [provider]  carvedilol (COREG) 6.25 MG tablet Take 1 tablet (6.25 mg total) by mouth 2 (two) times daily. 06/08/19  Yes Nahser, Wonda Cheng, MD  Cholecalciferol (VITAMIN D3) 1000 units CAPS Take 2,000 Units by mouth daily.   Yes [provider]  Cyanocobalamin (VITAMIN B12) 500 MCG TABS Take 1 tablet by mouth daily.   Yes [provider]  famotidine (PEPCID) 20 MG tablet Take 20 mg by mouth daily.   Yes [provider]  fluticasone (FLONASE) 50 MCG/ACT nasal spray Place 2 sprays into both nostrils daily. 04/06/19  Yes [provider]  ibandronate (BONIVA) 150 MG tablet Take 150 mg by  mouth every 30 (thirty) days. 04/20/19  Yes [provider]  Multiple Vitamin (MULTIVITAMIN) tablet Take 1 tablet by mouth daily.   Yes [provider]  Plant Sterols and Stanols (CHOLESTOFF PLUS) 450 MG CAPS Take 1 capsule by mouth daily.   Yes [provider]  Polyethyl Glycol-Propyl Glycol (SYSTANE FREE OP) Place 1 drop into both eyes daily as needed (Dry eyes).   Yes [provider]  PROAIR HFA 108 (90 Base) MCG/ACT inhaler Inhale 2 puffs into the lungs daily as needed for shortness of breath.  01/28/19  Yes [provider]  Probiotic Product (PROBIOTIC DAILY PO) Take 1 tablet by mouth daily.   Yes [provider]  Turmeric (QC TUMERIC COMPLEX) 500 MG CAPS Take 1,000 mg by mouth daily.   Yes [provider]  vitamin C (ASCORBIC ACID) 500 MG tablet Take 500 mg by mouth daily.   Yes [provider]    Physical Exam: Vitals:   12/30/19 1615 12/30/19 1630 12/30/19 1645 12/30/19 1700  BP: (!) 148/78 (!) 159/133 (!) 151/86 136/60  Pulse: 69 64 72 71  Resp: '17 13 19 17  ' Temp:      TempSrc:      SpO2: 99% 99% 98% 98%  Weight:      Height:        Constitutional: NAD, calm, comfortable Vitals:   12/30/19 1615 12/30/19 1630 12/30/19 1645 12/30/19 1700  BP: (!) 148/78 (!) 159/133 (!) 151/86 136/60  Pulse: 69 64 72 71  Resp: '17 13 19 17  ' Temp:      TempSrc:      SpO2: 99% 99% 98% 98%  Weight:      Height:       Eyes: PERRL, lids and conjunctivae normal ENMT: Mucous membranes are moist. Posterior pharynx clear of any exudate or lesions.Normal dentition.  Neck: normal, supple, no masses, no thyromegaly Respiratory: clear to auscultation bilaterally, no wheezing, no crackles. Normal respiratory effort. No accessory muscle use.  Cardiovascular: Regular rate and rhythm, no murmurs / rubs / gallops. No extremity edema. 2+ pedal pulses. No carotid bruits.  Abdomen: no tenderness, no masses palpated. No hepatosplenomegaly.  Bowel sounds positive.  Musculoskeletal: no clubbing / cyanosis. No joint deformity upper and lower extremities. Good ROM, no contractures. Normal muscle tone.  Skin: no rashes, lesions, ulcers. No induration Neurologic: CN 2-12 grossly intact. Sensation intact, DTR normal. Strength 5/5 in all 4.  Psychiatric: Normal judgment and insight. Alert and oriented x 3. Normal mood.     Labs on Admission: I have personally reviewed following labs and imaging studies  CBC: Recent Labs  Lab 12/30/19 0744  WBC 4.6  HGB 13.1  HCT 41.2  MCV 95.8  PLT 161   Basic Metabolic Panel: Recent Labs  Lab 12/30/19 0744  NA 138  K 4.6  CL 102  CO2 26  GLUCOSE 97  BUN 20  CREATININE 0.80  CALCIUM 10.3   GFR: Estimated Creatinine Clearance: 52.5 mL/min (by C-G formula based on SCr of 0.8 mg/dL). Liver Function Tests: No results for input(s): AST, ALT, ALKPHOS, BILITOT, PROT, ALBUMIN in the last 168 hours. No results for input(s): LIPASE, AMYLASE in the last 168 hours. No results for input(s): AMMONIA in the last 168 hours. Coagulation Profile: No results for input(s): INR, PROTIME in the last 168 hours. Cardiac Enzymes: No results for input(s): CKTOTAL, CKMB, CKMBINDEX, TROPONINI in the last 168 hours. BNP (last 3 results) No results for input(s): PROBNP in the last 8760 hours. HbA1C: No results for input(s): HGBA1C in the last 72 hours. CBG: No results for input(s): GLUCAP in the last 168 hours. Lipid Profile: No results for input(s): CHOL, HDL, LDLCALC, TRIG, CHOLHDL, LDLDIRECT in the last 72 hours. Thyroid Function Tests: No results for input(s): TSH, T4TOTAL, FREET4, T3FREE, THYROIDAB in the last 72 hours. Anemia Panel: No results for input(s): VITAMINB12, FOLATE, FERRITIN, TIBC, IRON, RETICCTPCT in the last 72 hours. Urine analysis:    Component Value Date/Time   COLORURINE STRAW (A) 12/30/2019 1415   APPEARANCEUR CLEAR 12/30/2019 1415   LABSPEC 1.009 12/30/2019 1415   PHURINE  7.0 12/30/2019 1415   GLUCOSEU NEGATIVE 12/30/2019 1415   HGBUR NEGATIVE 12/30/2019 1415   BILIRUBINUR NEGATIVE 12/30/2019 1415   KETONESUR NEGATIVE 12/30/2019 1415   PROTEINUR NEGATIVE 12/30/2019 1415   NITRITE NEGATIVE 12/30/2019 1415   LEUKOCYTESUR NEGATIVE 12/30/2019 1415    Radiological Exams on Admission: DG Chest 2 View  Result Date: 12/30/2019 CLINICAL DATA:  cp EXAM: CHEST - 2 VIEW COMPARISON:  Chest x-ray 01/19/2019, CT heart 01/14/2018, chest x-ray 08/29/15 FINDINGS: The heart size and mediastinal contours are within normal limits. Similar appearing hyperinflation of bilateral lungs. Biapical trace pleural/pulmonary scarring. No focal consolidation. Pericentimeter round density overlying the left lower lobe likely represents a nipple shadow. No pulmonary edema. No pleural effusion. No pneumothorax. Persistent partially visualized levoscoliosis of the lumbar spine with compensatory dextroscoliosis of the thoracic spine. Multilevel degenerative changes of the spine. No acute displaced fracture. IMPRESSION: No active cardiopulmonary disease. Electronically Signed   By: Iven Finn M.D.   On: 12/30/2019 08:26   CT Head Wo Contrast  Result Date: 12/30/2019 CLINICAL DATA:  Intermittent right-sided headache, rule out intercerebral hemorrhage prior to anticoagulation EXAM: CT HEAD WITHOUT CONTRAST TECHNIQUE: Contiguous axial images were obtained from the base of the skull through the vertex without intravenous contrast. COMPARISON:  None. FINDINGS: Brain: No evidence of acute infarction, hemorrhage, hydrocephalus, extra-axial collection or mass lesion/mass effect. Symmetric prominence of the ventricles, cisterns and sulci compatible with parenchymal volume loss. Patchy areas of white matter hypoattenuation are most compatible with chronic microvascular angiopathy. Basilar cisterns are patent. Midline intracranial structures are unremarkable. Cerebellar tonsils are normally positioned. Vascular:  Atherosclerotic calcification of the carotid siphons and intradural vertebral arteries. No hyperdense vessel. Skull: No calvarial fracture or suspicious osseous lesion. No scalp swelling or hematoma. Sinuses/Orbits: Paranasal sinuses and mastoid air cells are predominantly clear. Included orbital structures are unremarkable. Other: None IMPRESSION: 1. No acute intracranial abnormality. Specifically, no evidence of acute intracranial hemorrhage. 2. Mild parenchymal volume loss and chronic microvascular angiopathy. Electronically Signed   By: Lovena Le M.D.   On: 12/30/2019 16:02   CT CORONARY MORPH W/CTA COR W/SCORE W/CA W/CM &/OR WO/CM  Result Date: 12/30/2019 CLINICAL DATA:  Chest pain EXAM: Cardiac/Coronary CTA  TECHNIQUE: The patient was scanned on a [employer]. A 100 kV prospective scan was triggered in the descending thoracic aorta at 111 HU's. Axial non-contrast 3 mm slices were carried out through the heart. The data set was analyzed on a dedicated work station and scored using the [place]. Gantry rotation speed was 250 msecs and collimation was .6 mm. No beta blockade and 0.8 mg of sl NTG was given. The 3D data set was reconstructed in 5% intervals of the 35-75 % of the R-R cycle. Diastolic phases were analyzed on a dedicated work station using MPR, MIP and VRT modes. The patient received 80 cc of contrast. FINDINGS: Image quality: excellent. Noise artifact is: Limited. Coronary Arteries:  Normal coronary origin.  Right dominance. Left main: The left main is a large caliber vessel with a normal take off from the left coronary cusp that bifurcates to form a left anterior descending artery and a left circumflex artery. There is no plaque or stenosis. Left anterior descending artery: The LAD is patent without evidence of plaque or stenosis. The LAD gives off 2 patent diagonal branches. Left circumflex artery: The LCX is non-dominant and patent with no evidence of plaque or stenosis. The  LCX gives off 2 patent obtuse marginal branches. Right coronary artery: The RCA is dominant with normal take off from the right coronary cusp. There is no evidence of plaque or stenosis. The RCA terminates as a PDA and right posterolateral branch without evidence of plaque or stenosis. Right Atrium: Right atrial size is within normal limits. Right Ventricle: The right ventricular cavity is within normal limits. Left Atrium: Left atrial size is normal in size with no left atrial appendage filling defect. A PFO is present. Left Ventricle: The ventricular cavity size is within normal limits. There are no stigmata of prior infarction. There is no abnormal filling defect. Pulmonary arteries: Normal in size without proximal filling defect. Pulmonary veins: Normal pulmonary venous drainage. Pericardium: Normal thickness with no significant effusion or calcium present. Cardiac valves: The aortic valve is trileaflet without significant calcification. The mitral valve is normal structure without significant calcification. Aorta: Normal caliber with no significant disease. Extra-cardiac findings: See attached radiology report for non-cardiac structures. IMPRESSION: 1. Coronary calcium score of 0. 2. Normal coronary origin with right dominance. 3. Normal coronary arteries. 4. A PFO is present. RECOMMENDATIONS: 1. No evidence of CAD (0%). Consider non-atherosclerotic causes of chest pain. Eleonore Chiquito, MD Electronically Signed   By: Eleonore Chiquito   On: 12/30/2019 17:47    EKG: Independently reviewed.  Normal sinus rhythm no acute ST-T changes  Assessment/Plan Active Problems:   Chest pain  (please populate well all problems here in Problem List. (For example, if patient is on BP meds at home and you resume or decide to hold them, it is a problem that needs to be her. Same for CAD, COPD, HLD and so on)  Chest pain, ACS ruled out -Cardiology signed off after coronary artery CT results -Seems related to significant  elevation of her blood pressure, etiology such as coronary artery spasm, small branch PE (especially given she had a left ankle sudden sweating), myocarditis cannot be ruled out.  Discussed patient at bedside, will order VQ scan, echocardiogram and repeat troponin in the morning.  DVT study  Uncontrolled hypertension -Blood pressure normalized, continue home BP meds  Headaches -Patient also reported having frequent episodes of right-sided frontal headache this week, usually lasted about few seconds, not associated with headache or vision  changes.  No jaw pain. -ESR and outpt PCP F/U  Left ankle swelling and pain -DVT study  GERD -Denied any history of esophageal spasm -Continue PPI  DVT prophylaxis: Lovenox Code Status: Full code Family Communication: None at bedside Disposition Plan: Expected discharge within 24 hours after PE study, DVT study, and repeated troponins Consults called: Cardiology Admission status: Tele obs   Lequita Halt MD [facility] Pager [phone]  12/30/2019, 6:53 PM

## 2019-12-30 NOTE — ED Provider Notes (Addendum)
  Provider Note MRN:  [id number]  Arrival date & time: 12/30/19    ED Course and Medical Decision Making  Assumed care from Dr. Vallery Ridge at shift change.  Chest pain with rising troponin, EKG reassuring, starting heparin, anticipate admission to cardiology service.  Clinical Course as of Dec 29 1832  Thu Dec 30, 2019  1805 Evaluated by cardiology and with CT coronary study, which does not show any CAD and therefore cardiology feels that internal medicine admission is appropriate.   [MB]    Clinical Course User Index [MB] Maudie Flakes, MD   Accepted for admission to [facility] for further care.    .Critical Care Performed by: Maudie Flakes, MD Authorized by: Maudie Flakes, MD   Critical care provider statement:    Critical care time (minutes):  45   Critical care was necessary to treat or prevent imminent or life-threatening deterioration of the following conditions: Non-ST segment elevation myocardial infarction.   Critical care was time spent personally by me on the following activities:  Discussions with consultants, evaluation of patient's response to treatment, examination of patient, ordering and performing treatments and interventions, ordering and review of laboratory studies, ordering and review of radiographic studies, pulse oximetry, re-evaluation of patient's condition, obtaining history from patient or surrogate and review of old charts   I assumed direction of critical care for this patient from another provider in my specialty: yes      Final Clinical Impressions(s) / ED Diagnoses     ICD-10-CM   1. ACS (acute coronary syndrome) (HCC)  I24.9   2. Troponin level elevated  R77.8     ED Discharge Orders    None      Discharge Instructions   None     Barth Kirks. [place]onia Small, [place] [email or web address]    Maudie Flakes, MD 12/30/19 Melvia Heaps, MD 12/30/19 [phone]

## 2019-12-30 NOTE — Consult Note (Addendum)
Cardiology Consultation:   Patient ID: Chiyeko Ferre MRN: [id number]; DOB: 1950/08/08  Admit date: 12/30/2019 Date of Consult: 12/30/2019  Primary Care Provider: Kathyrn Lass, MD [facility] Cardiologist: Mertie Moores, MD  [facility] Electrophysiologist:  None    Patient Profile:   Sandra Park is a 69 y.o. female with a hx of palpitations, coronary Ca+ score was 0 in 2019 and hx of random chest pain with ETT in 05/2019 no ischemia + HTN, HLD, who is being seen today for the evaluation of chest pain at the request of Dr. Johnney Killian.  History of Present Illness:   Sandra Park with above hx and Ca+ score 0 in 2019 and normal ETT except for HTN 05/2019 echo with G1DD EF 65-70% trivial MVR trivial SR.      Now presents with lt ankle swelling earlier in AM and the area was swollen and now bruised.  then she felt lightheaded and with discomfort in her chest. A pressure 5/10 she had mild SOB and on way by EMS some nausea.  At home her BP was elevated at 865 systolic.  She had taken her meds about 15 min prior  Also recent Rt sided head aches last less than 1 min.   CT head without acute process.    EKG:  The EKG was personally reviewed and demonstrates:  SR and borderline Rt axis deviation HR 62 and no acute changes from prior  Telemetry:  Telemetry was personally reviewed and demonstrates:  SR  Na 138, K+ 4.6 Cr 0.80,  Hs troponin 23, 185 Hgb 13.1, plts 228, WBC 4.6   CXR 2V no active disease.  BP 151/104 on admit to 157/120  Not orthostatic  Currently no pain is feeling back to normal.    Past Medical History:  Diagnosis Date  . Arthritis   . GERD (gastroesophageal reflux disease)   . History of colitis 09/06/2014  . History of kidney stones   . Hypoproteinemia ([place])   . Nasal congestion   . Osteopenia   . Osteoporosis   . Pure hypercholesterolemia   . Renal cyst   . SVT (supraventricular tachycardia) ([facility])     Past Surgical History:  Procedure Laterality  Date  . COLONOSCOPY  2006, 2016   normal  . LAPAROSCOPY Left 1989,& 90's   salpingoopherectomy cyst x2     Home Medications:  Prior to Admission medications   Medication Sig Start Date End Date Taking? Authorizing Provider  aspirin EC 81 MG tablet Take 81 mg by mouth daily.    [provider]  Calcium Carbonate (CALCIUM 600 PO) Take 1 tablet by mouth daily.     [provider]  carvedilol (COREG) 6.25 MG tablet Take 1 tablet (6.25 mg total) by mouth 2 (two) times daily. 06/08/19   Nahser, Wonda Cheng, MD  Cholecalciferol (VITAMIN D3) 1000 units CAPS Take 2,000 Units by mouth daily.    [provider]  Cyanocobalamin (VITAMIN B12) 500 MCG TABS Take 1 tablet by mouth daily.    [provider]  famotidine (PEPCID) 20 MG tablet Take 20 mg by mouth daily.    [provider]  fluticasone (FLONASE) 50 MCG/ACT nasal spray Place 2 sprays into both nostrils daily. 04/06/19   [provider]  ibandronate (BONIVA) 150 MG tablet Take 150 mg by mouth every 30 (thirty) days. 04/20/19   [provider]  ibuprofen (ADVIL,MOTRIN) 200 MG tablet Take 800 mg by mouth every 6 (six) hours as  needed for moderate pain.    [provider]  Multiple Vitamin (MULTIVITAMIN) tablet Take 1 tablet by mouth daily.    [provider]  naproxen sodium (ALEVE) 220 MG tablet Take 220 mg by mouth daily as needed.    [provider]  Plant Sterols and Stanols (CHOLESTOFF PLUS) 450 MG CAPS Take 1 capsule by mouth daily.    [provider]  PROAIR HFA 108 [phone] Base) MCG/ACT inhaler Inhale 2 puffs into the lungs as needed. 01/28/19   [provider]  Probiotic Product (PROBIOTIC DAILY PO) Take 1 tablet by mouth daily.    [provider]  Turmeric (QC TUMERIC COMPLEX) 500 MG CAPS Take 1,000 mg by mouth daily.    [provider]  vitamin C (ASCORBIC ACID) 500 MG tablet Take 500 mg by mouth daily.    [provider]    Inpatient Medications: Scheduled Meds:  Continuous Infusions:  PRN Meds:   Allergies:   No Known Allergies  Social History:   Social History   Socioeconomic History  . Marital status: Married    Spouse name: Not on file  . Number of children: Not on file  . Years of education: Not on file  . Highest education level: Not on file  Occupational History  . Not on file  Tobacco Use  . Smoking status: Never Smoker  . Smokeless tobacco: Never Used  Substance and Sexual Activity  . Alcohol use: No  . Drug use: No  . Sexual activity: Not on file  Other Topics Concern  . 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 Difficulty of Paying Living Expenses: Not on file  Food Insecurity:   . Worried About Charity fundraiser in the Last Year: Not on file  . Ran Out of Food in the Last Year: Not on file  Transportation Needs:   . Lack of Transportation (Medical): Not on file  . Lack of Transportation (Non-Medical): Not on file  Physical Activity:   . Days of Exercise per Week: Not on file  . Minutes of Exercise per Session: Not on file  Stress:   . Feeling of Stress : Not on file  Social Connections:   . Frequency of Communication with Friends and Family: Not on file  . Frequency of Social Gatherings with Friends and Family: Not on file  . Attends Religious Services: Not on file  . Active Member of Clubs or Organizations: Not on file  . Attends Archivist Meetings: Not on file  . Marital Status: Not on file  Intimate Partner Violence:   . Fear of Current or Ex-Partner: Not on file  . Emotionally Abused: Not on file  . Physically Abused: Not on file  . Sexually Abused: Not on file    Family History:    Family History  Problem Relation Age of Onset  . Atrial fibrillation Mother   . Hypertension Mother    . Arthritis Mother   . Pneumonia Mother   . Atrial fibrillation Father   . Hypertension Father   . Arthritis Father   . Heart attack Father   . Clotting disorder Father        DVT's  . Suicidality Paternal Grandmother   . Hypertension Maternal Grandfather   .  Heart disease Maternal Grandfather   . Colon cancer Neg Hx      ROS:  Please see the history of present illness.  General:no colds or fevers, no weight changes Skin:no rashes or ulcers HEENT:no blurred vision, no congestion CV:see HPI PUL:see HPI GI:no diarrhea constipation or melena, no indigestion GU:no hematuria, no dysuria MS:no joint pain, no claudication, ankle pain, has leg pain but more numbness Neuro:no syncope, + lightheadedness today Endo:no diabetes, no thyroid disease  All other ROS reviewed and negative.     Physical Exam/Data:   Vitals:   12/30/19 1315 12/30/19 1322 12/30/19 1324 12/30/19 1327  BP: 132/75 133/71 125/70   Pulse: 69 69 63   Resp:  12    Temp:      TempSrc:      SpO2: 100% 99% 100%   Weight:    50.8 kg  Height:    5' 5.5" (1.664 m)   No intake or output data in the 24 hours ending 12/30/19 1523 Last 3 Weights 12/30/2019 08/19/2019 05/27/2019  Weight (lbs) 112 lb 113 lb 4 oz 117 lb 12.8 oz  Weight (kg) 50.803 kg 51.37 kg 53.434 kg     Body mass index is 18.35 kg/m.  General:  Frail female, in no acute distress HEENT: normal Lymph: no adenopathy Neck: no JVD Endocrine:  No thryomegaly Vascular: No carotid bruits; FA pulses 2+ bilaterally without bruits  Cardiac:  normal S1, S2; RRR; no murmur gallup rub or click Lungs:  clear to auscultation bilaterally, no wheezing, rhonchi or rales  Abd: soft, nontender, no hepatomegaly  Ext: no edema, bruise on Lt ankle where pain was earlier  Musculoskeletal:  No deformities, BUE and BLE strength normal and equal Skin: warm and dry  Neuro:  Alert and oriented X 3 MAE follows commands, no focal abnormalities noted Psych:  Normal affect     Relevant CV Studies: 06/08/19 ETT  Blood pressure demonstrated a hypertensive response to exercise.  No T wave inversion was noted during stress.  There was no ST segment deviation noted during stress.   NSR HTN response to exercise PVC with stress and recovery 1 mm upsloping ST depression peak exercise and recovery Not thought to be significant with significant artifact in ECG tracings   06/08/19 ECHO  IMPRESSIONS    1. Left ventricular ejection fraction, by estimation, is 65 to 70%. The  left ventricle has normal function. The left ventrical has no regional  wall motion abnormalities. Left ventricular diastolic parameters are  consistent with Grade I diastolic  dysfunction (impaired relaxation). The average left ventricular global  longitudinal strain is -23.1 %.  2. Right ventricular systolic function is normal. The right ventricular  size is normal. There is normal pulmonary artery systolic pressure.  3. Trivial mitral valve regurgitation.  4. The aortic valve is normal in structure and function. Aortic valve  regurgitation is trivial . No aortic stenosis is present.  5. The inferior vena cava is normal in size with greater than 50%  respiratory variability, suggesting right atrial pressure of 3 mmHg.   FINDINGS  Left Ventricle: Left ventricular ejection fraction, by estimation, is 65  to 70%. The left ventricle has normal function. The left ventricle has no  regional wall motion abnormalities. The average left ventricular global  longitudinal strain is -23.1 %.  The left ventricular internal cavity size was normal in size. There is no  left ventricular hypertrophy. Left ventricular diastolic parameters are  consistent with Grade I diastolic dysfunction (impaired  relaxation).   Right Ventricle: The right ventricular size is normal. No increase in  right ventricular wall thickness. Right ventricular systolic function is  normal. There is normal pulmonary artery  systolic pressure. The tricuspid  regurgitant velocity is 2.34 m/s, and  with an assumed right atrial pressure of 3 mmHg, the estimated right  ventricular systolic pressure is 38.7 mmHg.   Left Atrium: Left atrial size was normal in size.   Right Atrium: Right atrial size was normal in size.   Pericardium: There is no evidence of pericardial effusion.   Mitral Valve: The mitral valve is normal in structure and function. Normal  mobility of the mitral valve leaflets. Trivial mitral valve regurgitation.  No evidence of mitral valve stenosis.   Tricuspid Valve: The tricuspid valve is normal in structure. Tricuspid  valve regurgitation is trivial. No evidence of tricuspid stenosis.   Aortic Valve: The aortic valve is normal in structure and function. Aortic  valve regurgitation is trivial. Aortic regurgitation PHT measures 467  msec. No aortic stenosis is present.   Pulmonic Valve: The pulmonic valve was normal in structure. Pulmonic valve  regurgitation is trivial. No evidence of pulmonic stenosis.   Aorta: The aortic root is normal in size and structure.   Venous: The inferior vena cava is normal in size with greater than 50%  respiratory variability, suggesting right atrial pressure of 3 mmHg. The  inferior vena cava and the hepatic vein show a normal flow pattern.   IAS/Shunts: No atrial level shunt detected by color flow Doppler.   Laboratory Data:  High Sensitivity Troponin:   Recent Labs  Lab 12/30/19 0744 12/30/19 0940  TROPONINIHS 23* 185*     Chemistry Recent Labs  Lab 12/30/19 0744  NA 138  K 4.6  CL 102  CO2 26  GLUCOSE 97  BUN 20  CREATININE 0.80  CALCIUM 10.3  GFRNONAA >60  GFRAA >60  ANIONGAP 10    No results for input(s): PROT, ALBUMIN, AST, ALT, ALKPHOS, BILITOT in the last 168 hours. Hematology Recent Labs  Lab 12/30/19 0744  WBC 4.6  RBC 4.30  HGB 13.1  HCT 41.2  MCV 95.8  MCH 30.5  MCHC 31.8  RDW 12.6  PLT 228   BNPNo results  for input(s): BNP, PROBNP in the last 168 hours.  DDimer No results for input(s): DDIMER in the last 168 hours.   Radiology/Studies:  DG Chest 2 View  Result Date: 12/30/2019 CLINICAL DATA:  cp EXAM: CHEST - 2 VIEW COMPARISON:  Chest x-ray 01/19/2019, CT heart 01/14/2018, chest x-ray 08/29/15 FINDINGS: The heart size and mediastinal contours are within normal limits. Similar appearing hyperinflation of bilateral lungs. Biapical trace pleural/pulmonary scarring. No focal consolidation. Pericentimeter round density overlying the left lower lobe likely represents a nipple shadow. No pulmonary edema. No pleural effusion. No pneumothorax. Persistent partially visualized levoscoliosis of the lumbar spine with compensatory dextroscoliosis of the thoracic spine. Multilevel degenerative changes of the spine. No acute displaced fracture. IMPRESSION: No active cardiopulmonary disease. Electronically Signed   By: Iven Finn M.D.   On: 12/30/2019 08:26       HEAR Score (for undifferentiated chest pain):     4     Assessment and Plan:   1. Chest pain in combination with HTN.  Elevated troponin, no EKG changes. May be from HTN.  Will repeat hs Troponin - may need Cardiac CTA -though Ca+ score is 0 could think of PE with Lt ankle pain and dizziness with chest  pressure.  Did go to beach by car 2 weeks ago. Though ankle pain does seem like broken varicose vein.  Unknown lipids.  rec'd ASA by EMS.   Plan cardiac CTA - unsure if can do tonight 2. HTN uncontrolled despite taking meds today on coreg 6.25 BID, she does have hypertensive response to exercise. 3. DOE with HTN and exercise improved with coreg, would increase dose  4. Lt ankle pain, varicose vein rupture 5. Brief headaches neg CT of head.      For questions or updates, please contact [place] Please consult [email or web address] for contact info under    Signed, Cecilie Kicks, NP  12/30/2019 3:23 PM   History and all data above reviewed.  Patient  examined.  I agree with the findings as above.  The patient presents with chest pain and light headedness.  She felt this this morning similar to an episode that she had last year when she had the work up by Dr. Acie Fredrickson.  She has had a zero calcium score and negative POET (Plain Old Exercise Treadmill).  However, this morning she had a feeling that sounded like light headedness or presyncope that came over her and was associated with some chest discomfort although this was not the predominant complaint.  She did not have syncope and did not have jaw, arm discomfort.  She had no SOB.  She did not feel palpitations.  She did check her BP and the systolic was 030.  In the ED her EKG has been non acute   High sensitivity troponin is elevated as above.  The patient exam reveals COR: RRR , systolic murmur that increases slightly with the strain phase of Valsalva ,  Lungs: Clear  ,  Abd: Positive bowel sounds, no rebound no guarding, Ext No edema  .  All available labs, radiology testing, previous records reviewed. Agree with documented assessment and plan.   Chest pain:  Elevated troponin.  Likely this is related to hypertensive episode.   However with this and the pain we need to rule out obstructive CAD.  I will try to order a CT angiogram in the ED today.  HTN:  The patient reports that her BP is usually below 130.  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  No change in therapy at this time.   Murmur:  Dynamic murmur but without significant findings on recent echo.  I have suggest that she have a repeat echo in a couple of years with Valsalva and she does have follow up for this suggested by Dr. Acie Fredrickson as well.  She will be sure that this happens.   Jeneen Rinks Jerrick Farve  5:27 PM  12/30/2019

## 2019-12-31 ENCOUNTER — Observation Stay (HOSPITAL_COMMUNITY): Payer: PPO

## 2019-12-31 DIAGNOSIS — R55 Syncope and collapse: Secondary | ICD-10-CM | POA: Diagnosis not present

## 2019-12-31 DIAGNOSIS — R079 Chest pain, unspecified: Secondary | ICD-10-CM | POA: Diagnosis not present

## 2019-12-31 DIAGNOSIS — R778 Other specified abnormalities of plasma proteins: Secondary | ICD-10-CM | POA: Diagnosis not present

## 2019-12-31 DIAGNOSIS — I10 Essential (primary) hypertension: Secondary | ICD-10-CM

## 2019-12-31 DIAGNOSIS — R42 Dizziness and giddiness: Secondary | ICD-10-CM | POA: Diagnosis not present

## 2019-12-31 LAB — TROPONIN I (HIGH SENSITIVITY): Troponin I (High Sensitivity): 83 ng/L — ABNORMAL HIGH (ref <18)

## 2019-12-31 LAB — D-DIMER, QUANTITATIVE: D-Dimer, Quant: <0.27 ug/mL-FEU (ref 0.00–0.50)

## 2019-12-31 LAB — SARS CORONAVIRUS 2 (TAT 6-24 HRS): SARS Coronavirus 2: NEGATIVE

## 2019-12-31 LAB — SEDIMENTATION RATE: Sed Rate: 1 mm/hr (ref 0–22)

## 2019-12-31 NOTE — Discharge Summary (Signed)
Physician Discharge Summary  Shawntay Prest Kermit IOX:[id number] DOB: 12-20-1950 DOA: 12/30/2019  PCP: Kathyrn Lass, MD  Admit date: 12/30/2019 Discharge date: 12/31/2019  Admitted From: home  Disposition:  home   Recommendations for Outpatient Follow-up:  1. F/u on HTN  Home Health:  none Discharge Condition:  stable   CODE STATUS:  Full code   Diet recommendation:  Heart healthy Consultations:  cardiology  Procedures/Studies: . CTA   Discharge Diagnoses:  Principal Problem:   Near syncope Active Problems:   Chest pain   Benign essential HTN     Brief Summary: Sandra Park is a 69 y.o. female with medical history significant of HTN, GERD, osteoporosis, presented with new onset of chest pain and near syncope.  Patient woke up this morning around 630 and started to feel severe lightheadedness with blurry vision, " almost like about to pass out" she had to lie down again, soon after she started to feel pressure-like chest pain 10/10, nonradiating, associated with shortness of breath, chest pain constant, no exacerbation or relieving factors.  She checked her blood pressure at home and found her blood pressure was significantly elevated to 170s.  She called EMS, who gave her some aspirin.  By 10:00, her lightheadedness and chest pain resolved.  Denied any feeling of palpitations, no nauseous vomit no abdominal pain.  During the episode, patient also noticed a small bump on her left inner ankle, " almost looked like a blood vessel popped up" she applied ice pack to it and the bump strength within 1 to 2 hours.  She did not feel any pain denies any calf pain.  She drove back from [place] last Monday and the travel was around 3 hours.  Hospital Course:  Chest pain/ near syncope - her symptoms actually included "feeling flushed", light headed with dyspnea and some mild chest pain- they were associated with a spike in her blood pressure with systolic going up to 268T -  she had a similar  episode in 12/20 which was worked up by cardiology with an ECHO, Myoview stress test and a heart monitor and no etiology was found  - normal Cardiac CT yesterday in the ED - her symptoms have resolved - telemetry has been normal-  - she has a normal d dimer and therefore I do not feel she warrants a work up for a PE - ? If her symptoms were related to the uncontrolled BP - she is stable to d/c home- I have asked her to take her BP daily and keep a record of it, drink plenty of fluids and keep a record of any recurrent symptoms to discuss with her PCP    Discharge Exam: Vitals:   12/31/19 0918 12/31/19 1248  BP: 108/63 126/67  Pulse: 69 60  Resp: 16 18  Temp: 98.4 F (36.9 C) 97.6 F (36.4 C)  SpO2: 99% 100%   Vitals:   12/31/19 0035 12/31/19 0442 12/31/19 0918 12/31/19 1248  BP: 111/61 (!) 114/55 108/63 126/67  Pulse: 62 65 69 60  Resp: 20 20 16 18   Temp: 97.7 F (36.5 C) 98 F (36.7 C) 98.4 F (36.9 C) 97.6 F (36.4 C)  TempSrc: Oral Oral Oral Oral  SpO2: 99% 98% 99% 100%  Weight: 50.4 kg     Height:        General: Pt is alert, awake, not in acute distress Cardiovascular: RRR, S1/S2 +, no rubs, no gallops Respiratory: CTA bilaterally, no wheezing, no rhonchi Abdominal: Soft,  NT, ND, bowel sounds + Extremities: no edema, no cyanosis   Discharge Instructions  Discharge Instructions    Diet - low sodium heart healthy   Complete by: As directed    Increase activity slowly   Complete by: As directed      Allergies as of 12/31/2019   No Known Allergies     Medication List    TAKE these medications   acetaminophen 500 MG tablet Commonly known as: TYLENOL Take 1,000 mg by mouth at bedtime.   aspirin EC 81 MG tablet Take 81 mg by mouth daily.   CALCIUM 600 PO Take 1 tablet by mouth daily.   carvedilol 6.25 MG tablet Commonly known as: COREG Take 1 tablet (6.25 mg total) by mouth 2 (two) times daily.   CholestOff Plus 450 MG Caps Generic drug: Plant  Sterols and Stanols Take 1 capsule by mouth daily.   famotidine 20 MG tablet Commonly known as: PEPCID Take 20 mg by mouth daily.   fluticasone 50 MCG/ACT nasal spray Commonly known as: FLONASE Place 2 sprays into both nostrils daily.   ibandronate 150 MG tablet Commonly known as: BONIVA Take 150 mg by mouth every 30 (thirty) days.   multivitamin tablet Take 1 tablet by mouth daily.   ProAir HFA 108 (90 Base) MCG/ACT inhaler Generic drug: albuterol Inhale 2 puffs into the lungs daily as needed for shortness of breath.   PROBIOTIC DAILY PO Take 1 tablet by mouth daily.   QC Tumeric Complex 500 MG Caps Generic drug: Turmeric Take 1,000 mg by mouth daily.   SYSTANE FREE OP Place 1 drop into both eyes daily as needed (Dry eyes).   Vitamin B12 500 MCG Tabs Take 1 tablet by mouth daily.   vitamin C 500 MG tablet Commonly known as: ASCORBIC ACID Take 500 mg by mouth daily.   Vitamin D3 25 MCG (1000 UT) Caps Take 2,000 Units by mouth daily.       Follow-up Information    Kathyrn Lass, MD Follow up.   Specialty: Family Medicine Contact information: [place] [address] [phone]        Nahser, Wonda Cheng, MD .   Specialty: Cardiology Contact information: [address] [place] [address] [phone]              No Known Allergies    DG Chest 2 View  Result Date: 12/30/2019 CLINICAL DATA:  cp EXAM: CHEST - 2 VIEW COMPARISON:  Chest x-ray 01/19/2019, CT heart 01/14/2018, chest x-ray 08/29/15 FINDINGS: The heart size and mediastinal contours are within normal limits. Similar appearing hyperinflation of bilateral lungs. Biapical trace pleural/pulmonary scarring. No focal consolidation. Pericentimeter round density overlying the left lower lobe likely represents a nipple shadow. No pulmonary edema. No pleural effusion. No pneumothorax. Persistent partially visualized levoscoliosis of the lumbar spine with compensatory  dextroscoliosis of the thoracic spine. Multilevel degenerative changes of the spine. No acute displaced fracture. IMPRESSION: No active cardiopulmonary disease. Electronically Signed   By: Iven Finn M.D.   On: 12/30/2019 08:26   CT Head Wo Contrast  Result Date: 12/30/2019 CLINICAL DATA:  Intermittent right-sided headache, rule out intercerebral hemorrhage prior to anticoagulation EXAM: CT HEAD WITHOUT CONTRAST TECHNIQUE: Contiguous axial images were obtained from the base of the skull through the vertex without intravenous contrast. COMPARISON:  None. FINDINGS: Brain: No evidence of acute infarction, hemorrhage, hydrocephalus, extra-axial collection or mass lesion/mass effect. Symmetric prominence of the ventricles, cisterns and sulci compatible  with parenchymal volume loss. Patchy areas of white matter hypoattenuation are most compatible with chronic microvascular angiopathy. Basilar cisterns are patent. Midline intracranial structures are unremarkable. Cerebellar tonsils are normally positioned. Vascular: Atherosclerotic calcification of the carotid siphons and intradural vertebral arteries. No hyperdense vessel. Skull: No calvarial fracture or suspicious osseous lesion. No scalp swelling or hematoma. Sinuses/Orbits: Paranasal sinuses and mastoid air cells are predominantly clear. Included orbital structures are unremarkable. Other: None IMPRESSION: 1. No acute intracranial abnormality. Specifically, no evidence of acute intracranial hemorrhage. 2. Mild parenchymal volume loss and chronic microvascular angiopathy. Electronically Signed   By: Lovena Le M.D.   On: 12/30/2019 16:02   CT CORONARY MORPH W/CTA COR W/SCORE W/CA W/CM &/OR WO/CM  Addendum Date: 12/31/2019   ADDENDUM REPORT: 12/31/2019 10:23 EXAM: OVER-READ INTERPRETATION  CT CHEST The following report is an over-read performed by radiologist Dr. Eben Burow [facility] on 12/31/2019. This over-read does not include  interpretation of cardiac or coronary anatomy or pathology. The coronary calcium score/coronary CTA interpretation by the cardiologist is attached. COMPARISON:  None. FINDINGS: The visualized portions of the lower lung fields show no suspicious nodules, masses, or infiltrates. No pleural fluid seen. Mild scarring noted in the inferior aspect of the right middle lobe. The visualized portions of the mediastinum and chest wall are unremarkable. IMPRESSION: No significant non-cardiovascular abnormality seen in visualized portion of the thorax. Electronically Signed   By: Marlaine Hind M.D.   On: 12/31/2019 10:23   Result Date: 12/31/2019 CLINICAL DATA:  Chest pain EXAM: Cardiac/Coronary CTA TECHNIQUE: The patient was scanned on a [employer]. A 100 kV prospective scan was triggered in the descending thoracic aorta at 111 HU's. Axial non-contrast 3 mm slices were carried out through the heart. The data set was analyzed on a dedicated work station and scored using the [place]. Gantry rotation speed was 250 msecs and collimation was .6 mm. No beta blockade and 0.8 mg of sl NTG was given. The 3D data set was reconstructed in 5% intervals of the 35-75 % of the R-R cycle. Diastolic phases were analyzed on a dedicated work station using MPR, MIP and VRT modes. The patient received 80 cc of contrast. FINDINGS: Image quality: excellent. Noise artifact is: Limited. Coronary Arteries:  Normal coronary origin.  Right dominance. Left main: The left main is a large caliber vessel with a normal take off from the left coronary cusp that bifurcates to form a left anterior descending artery and a left circumflex artery. There is no plaque or stenosis. Left anterior descending artery: The LAD is patent without evidence of plaque or stenosis. The LAD gives off 2 patent diagonal branches. Left circumflex artery: The LCX is non-dominant and patent with no evidence of plaque or stenosis. The LCX gives off 2 patent obtuse  marginal branches. Right coronary artery: The RCA is dominant with normal take off from the right coronary cusp. There is no evidence of plaque or stenosis. The RCA terminates as a PDA and right posterolateral branch without evidence of plaque or stenosis. Right Atrium: Right atrial size is within normal limits. Right Ventricle: The right ventricular cavity is within normal limits. Left Atrium: Left atrial size is normal in size with no left atrial appendage filling defect. A PFO is present. Left Ventricle: The ventricular cavity size is within normal limits. There are no stigmata of prior infarction. There is no abnormal filling defect. Pulmonary arteries: Normal in size without proximal filling defect. Pulmonary veins: Normal pulmonary venous  drainage. Pericardium: Normal thickness with no significant effusion or calcium present. Cardiac valves: The aortic valve is trileaflet without significant calcification. The mitral valve is normal structure without significant calcification. Aorta: Normal caliber with no significant disease. Extra-cardiac findings: See attached radiology report for non-cardiac structures. IMPRESSION: 1. Coronary calcium score of 0. 2. Normal coronary origin with right dominance. 3. Normal coronary arteries. 4. A PFO is present. RECOMMENDATIONS: 1. No evidence of CAD (0%). Consider non-atherosclerotic causes of chest pain. Eleonore Chiquito, MD Electronically Signed: By: Eleonore Chiquito On: 12/30/2019 17:47     The results of significant diagnostics from this hospitalization (including imaging, microbiology, ancillary and laboratory) are listed below for reference.     Microbiology: Recent Results (from the past 240 hour(s))  SARS CORONAVIRUS 2 (TAT 6-24 HRS) Nasopharyngeal Nasopharyngeal Swab     Status: None   Collection Time: 12/30/19  7:51 PM   Specimen: Nasopharyngeal Swab  Result Value Ref Range Status   SARS Coronavirus 2 NEGATIVE NEGATIVE Final    Comment: (NOTE) SARS-CoV-2  target nucleic acids are NOT DETECTED.  The SARS-CoV-2 RNA is generally detectable in upper and lower respiratory specimens during the acute phase of infection. Negative results do not preclude SARS-CoV-2 infection, do not rule out co-infections with other pathogens, and should not be used as the sole basis for treatment or other patient management decisions. Negative results must be combined with clinical observations, patient history, and epidemiological information. The expected result is Negative.  Fact Sheet for Patients: SugarRoll.be  Fact Sheet for Healthcare Providers: https://www.woods-mathews.com/  This test is not yet approved or cleared by the [place] FDA and  has been authorized for detection and/or diagnosis of SARS-CoV-2 by FDA under an Emergency Use Authorization (EUA). This EUA will remain  in effect (meaning this test can be used) for the duration of the COVID-19 declaration under Se ction 564(b)(1) of the Act, 21 U.S.C. section 360bbb-3(b)(1), unless the authorization is terminated or revoked sooner.  Performed at [facility], [place] [address] [place], [place] [address]      Labs: BNP (last 3 results) No results for input(s): BNP in the last 8760 hours. Basic Metabolic Panel: Recent Labs  Lab 12/30/19 0744  NA 138  K 4.6  CL 102  CO2 26  GLUCOSE 97  BUN 20  CREATININE 0.80  CALCIUM 10.3   Liver Function Tests: No results for input(s): AST, ALT, ALKPHOS, BILITOT, PROT, ALBUMIN in the last 168 hours. No results for input(s): LIPASE, AMYLASE in the last 168 hours. No results for input(s): AMMONIA in the last 168 hours. CBC: Recent Labs  Lab 12/30/19 0744  WBC 4.6  HGB 13.1  HCT 41.2  MCV 95.8  PLT 228   Cardiac Enzymes: Recent Labs  Lab 12/30/19 1850  CKTOTAL 45   BNP: Invalid input(s): POCBNP CBG: No results for input(s): GLUCAP in the last 168 hours. D-Dimer Recent Labs     12/31/19 1255  DDIMER <0.27   Hgb A1c No results for input(s): HGBA1C in the last 72 hours. Lipid Profile No results for input(s): CHOL, HDL, LDLCALC, TRIG, CHOLHDL, LDLDIRECT in the last 72 hours. Thyroid function studies No results for input(s): TSH, T4TOTAL, T3FREE, THYROIDAB in the last 72 hours.  Invalid input(s): FREET3 Anemia work up No results for input(s): VITAMINB12, FOLATE, FERRITIN, TIBC, IRON, RETICCTPCT in the last 72 hours. Urinalysis    Component Value Date/Time   COLORURINE STRAW (A) 12/30/2019 [place] 12/30/2019 1415  LABSPEC 1.009 12/30/2019 1415   PHURINE 7.0 12/30/2019 1415   GLUCOSEU NEGATIVE 12/30/2019 1415   HGBUR NEGATIVE 12/30/2019 1415   [place] 12/30/2019 1415   KETONESUR NEGATIVE 12/30/2019 1415   PROTEINUR NEGATIVE 12/30/2019 1415   NITRITE NEGATIVE 12/30/2019 1415   LEUKOCYTESUR NEGATIVE 12/30/2019 1415   Sepsis Labs Invalid input(s): PROCALCITONIN,  WBC,  LACTICIDVEN Microbiology Recent Results (from the past 240 hour(s))  SARS CORONAVIRUS 2 (TAT 6-24 HRS) Nasopharyngeal Nasopharyngeal Swab     Status: None   Collection Time: 12/30/19  7:51 PM   Specimen: Nasopharyngeal Swab  Result Value Ref Range Status   SARS Coronavirus 2 NEGATIVE NEGATIVE Final    Comment: (NOTE) SARS-CoV-2 target nucleic acids are NOT DETECTED.  The SARS-CoV-2 RNA is generally detectable in upper and lower respiratory specimens during the acute phase of infection. Negative results do not preclude SARS-CoV-2 infection, do not rule out co-infections with other pathogens, and should not be used as the sole basis for treatment or other patient management decisions. Negative results must be combined with clinical observations, patient history, and epidemiological information. The expected result is Negative.  Fact Sheet for Patients: SugarRoll.be  Fact Sheet for Healthcare  Providers: https://www.woods-mathews.com/  This test is not yet approved or cleared by the [place] FDA and  has been authorized for detection and/or diagnosis of SARS-CoV-2 by FDA under an Emergency Use Authorization (EUA). This EUA will remain  in effect (meaning this test can be used) for the duration of the COVID-19 declaration under Se ction 564(b)(1) of the Act, 21 U.S.C. section 360bbb-3(b)(1), unless the authorization is terminated or revoked sooner.  Performed at [facility], [place] [address], [place], [place] [address]      Time coordinating discharge in minutes: 65  SIGNED:   Debbe Odea, MD  [facility] 12/31/2019, 2:39 PM

## 2019-12-31 NOTE — Progress Notes (Signed)
Verbally informed MD of need for CXR prior to lung scan.

## 2019-12-31 NOTE — Progress Notes (Signed)
Progress Note  Patient Name: Sandra Park Date of Encounter: 12/31/2019  Primary Cardiologist:   Mertie Moores, MD   Subjective   No pain or SOB.   Inpatient Medications    Scheduled Meds: . acetaminophen  1,000 mg Oral QHS  . acidophilus  1 capsule Oral Daily  . aspirin EC  81 mg Oral Daily  . carvedilol  6.25 mg Oral BID  . enoxaparin (LOVENOX) injection  40 mg Subcutaneous Q24H  . famotidine  20 mg Oral Daily  . fluticasone  2 spray Each Nare Daily  . vitamin B-12  500 mcg Oral Daily   Continuous Infusions:  PRN Meds: acetaminophen **OR** acetaminophen, albuterol, metoprolol tartrate   Vital Signs    Vitals:   12/30/19 2005 12/30/19 2037 12/31/19 0035 12/31/19 0442  BP: (!) 144/75 (!) 144/71 111/61 (!) 114/55  Pulse: 62 (!) 59 62 65  Resp: 16 20 20 20   Temp: 97.9 F (36.6 C) 97.7 F (36.5 C) 97.7 F (36.5 C) 98 F (36.7 C)  TempSrc: Oral Oral Oral Oral  SpO2: 100% 100% 99% 98%  Weight:  50.6 kg 50.4 kg   Height:  5\' 5"  (1.651 m)      Intake/Output Summary (Last 24 hours) at 12/31/2019 0758 Last data filed at 12/31/2019 0445 Gross per 24 hour  Intake 720 ml  Output 650 ml  Net 70 ml   Filed Weights   12/30/19 1327 12/30/19 2037 12/31/19 0035  Weight: 50.8 kg 50.6 kg 50.4 kg    Telemetry    Artifact and NSR.   - Personally Reviewed ECG    NA - Personally Reviewed  Physical Exam   GEN: No acute distress.   Cardiac:  RRR, no murmurs, rubs, or gallops.  Respiratory: Clear  to auscultation bilaterally.   Labs    Chemistry Recent Labs  Lab 12/30/19 0744  NA 138  K 4.6  CL 102  CO2 26  GLUCOSE 97  BUN 20  CREATININE 0.80  CALCIUM 10.3  GFRNONAA >60  GFRAA >60  ANIONGAP 10     Hematology Recent Labs  Lab 12/30/19 0744  WBC 4.6  RBC 4.30  HGB 13.1  HCT 41.2  MCV 95.8  MCH 30.5  MCHC 31.8  RDW 12.6  PLT 228    Cardiac EnzymesNo results for input(s): TROPONINI in the last 168 hours. No results for input(s):  TROPIPOC in the last 168 hours.   BNPNo results for input(s): BNP, PROBNP in the last 168 hours.   DDimer No results for input(s): DDIMER in the last 168 hours.   Radiology    DG Chest 2 View  Result Date: 12/30/2019 CLINICAL DATA:  cp EXAM: CHEST - 2 VIEW COMPARISON:  Chest x-ray 01/19/2019, CT heart 01/14/2018, chest x-ray 08/29/15 FINDINGS: The heart size and mediastinal contours are within normal limits. Similar appearing hyperinflation of bilateral lungs. Biapical trace pleural/pulmonary scarring. No focal consolidation. Pericentimeter round density overlying the left lower lobe likely represents a nipple shadow. No pulmonary edema. No pleural effusion. No pneumothorax. Persistent partially visualized levoscoliosis of the lumbar spine with compensatory dextroscoliosis of the thoracic spine. Multilevel degenerative changes of the spine. No acute displaced fracture. IMPRESSION: No active cardiopulmonary disease. Electronically Signed   By: Iven Finn M.D.   On: 12/30/2019 08:26   CT Head Wo Contrast  Result Date: 12/30/2019 CLINICAL DATA:  Intermittent right-sided headache, rule out intercerebral hemorrhage prior to anticoagulation EXAM: CT HEAD WITHOUT CONTRAST TECHNIQUE: Contiguous axial images  were obtained from the base of the skull through the vertex without intravenous contrast. COMPARISON:  None. FINDINGS: Brain: No evidence of acute infarction, hemorrhage, hydrocephalus, extra-axial collection or mass lesion/mass effect. Symmetric prominence of the ventricles, cisterns and sulci compatible with parenchymal volume loss. Patchy areas of white matter hypoattenuation are most compatible with chronic microvascular angiopathy. Basilar cisterns are patent. Midline intracranial structures are unremarkable. Cerebellar tonsils are normally positioned. Vascular: Atherosclerotic calcification of the carotid siphons and intradural vertebral arteries. No hyperdense vessel. Skull: No calvarial fracture or  suspicious osseous lesion. No scalp swelling or hematoma. Sinuses/Orbits: Paranasal sinuses and mastoid air cells are predominantly clear. Included orbital structures are unremarkable. Other: None IMPRESSION: 1. No acute intracranial abnormality. Specifically, no evidence of acute intracranial hemorrhage. 2. Mild parenchymal volume loss and chronic microvascular angiopathy. Electronically Signed   By: Lovena Le M.D.   On: 12/30/2019 16:02   CT CORONARY MORPH W/CTA COR W/SCORE W/CA W/CM &/OR WO/CM  Result Date: 12/30/2019 CLINICAL DATA:  Chest pain EXAM: Cardiac/Coronary CTA TECHNIQUE: The patient was scanned on a [employer]. A 100 kV prospective scan was triggered in the descending thoracic aorta at 111 HU's. Axial non-contrast 3 mm slices were carried out through the heart. The data set was analyzed on a dedicated work station and scored using the [place]. Gantry rotation speed was 250 msecs and collimation was .6 mm. No beta blockade and 0.8 mg of sl NTG was given. The 3D data set was reconstructed in 5% intervals of the 35-75 % of the R-R cycle. Diastolic phases were analyzed on a dedicated work station using MPR, MIP and VRT modes. The patient received 80 cc of contrast. FINDINGS: Image quality: excellent. Noise artifact is: Limited. Coronary Arteries:  Normal coronary origin.  Right dominance. Left main: The left main is a large caliber vessel with a normal take off from the left coronary cusp that bifurcates to form a left anterior descending artery and a left circumflex artery. There is no plaque or stenosis. Left anterior descending artery: The LAD is patent without evidence of plaque or stenosis. The LAD gives off 2 patent diagonal branches. Left circumflex artery: The LCX is non-dominant and patent with no evidence of plaque or stenosis. The LCX gives off 2 patent obtuse marginal branches. Right coronary artery: The RCA is dominant with normal take off from the right coronary cusp.  There is no evidence of plaque or stenosis. The RCA terminates as a PDA and right posterolateral branch without evidence of plaque or stenosis. Right Atrium: Right atrial size is within normal limits. Right Ventricle: The right ventricular cavity is within normal limits. Left Atrium: Left atrial size is normal in size with no left atrial appendage filling defect. A PFO is present. Left Ventricle: The ventricular cavity size is within normal limits. There are no stigmata of prior infarction. There is no abnormal filling defect. Pulmonary arteries: Normal in size without proximal filling defect. Pulmonary veins: Normal pulmonary venous drainage. Pericardium: Normal thickness with no significant effusion or calcium present. Cardiac valves: The aortic valve is trileaflet without significant calcification. The mitral valve is normal structure without significant calcification. Aorta: Normal caliber with no significant disease. Extra-cardiac findings: See attached radiology report for non-cardiac structures. IMPRESSION: 1. Coronary calcium score of 0. 2. Normal coronary origin with right dominance. 3. Normal coronary arteries. 4. A PFO is present. RECOMMENDATIONS: 1. No evidence of CAD (0%). Consider non-atherosclerotic causes of chest pain. Eleonore Chiquito, MD Electronically Signed  ByEleonore Chiquito   On: 12/30/2019 17:47    Cardiac Studies   CARDIAC CT  IMPRESSION: 1. Coronary calcium score of 0.  2. Normal coronary origin with right dominance.  3. Normal coronary arteries.  4. A PFO is present.    Patient Profile     69 y.o. female with a hx of palpitations, coronary Ca+ score was 0 in 2019 and hx of random chest pain with ETT in 05/2019 no ischemia + HTN, HLD, who is being seen today for the evaluation of chest pain at the request of Dr. Johnney Killian.  Assessment & Plan    ELEVATED TROPONIN:  No CAD.  No further invasive cardiac work up.  Possibly related to episode of HTN.      (Primary team  please note I don't see the radiology overread for the non cardiac structures this CT and this needs to be completed as part of the total reading.)  PFO:  Incidental finding.  Unlikely to be contributing but I would suggest out patient neurology work up given the complaint of dizziness.  Doubt atypical migraine or suggestion of TIA.    No acute findings on the head CT  For questions or updates, please contact [facility] Please consult [email or web address] for contact info under Cardiology/STEMI.   Signed, Minus Breeding, MD  12/31/2019, 7:58 AM

## 2019-12-31 NOTE — Progress Notes (Signed)
Dr. Percival Spanish does not believe pt needs Echo repeated.  So it was cancelled.  VQ may not be needed.  Cardiac CTA has not been read by radiologist for lung details.  May be prudent to wait for that.

## 2019-12-31 NOTE — Discharge Instructions (Signed)

## 2019-12-31 NOTE — Care Management Obs Status (Signed)
[facility] NOTIFICATION   Patient Details  Name: Sandra Park MRN: [id number] Date of Birth: 12/17/1950   Medicare Observation Status Notification Given:  Yes    Zenon Mayo, RN 12/31/2019, 3:09 PM

## 2020-01-05 MED FILL — CARVEDILOL 6.25 MG TABLET: 6.25 | 30 days supply | Qty: 60 | Fill #7

## 2020-01-11 DIAGNOSIS — Z681 Body mass index (BMI) 19 or less, adult: Secondary | ICD-10-CM | POA: Diagnosis not present

## 2020-01-11 DIAGNOSIS — I7090 Unspecified atherosclerosis: Secondary | ICD-10-CM | POA: Diagnosis not present

## 2020-01-11 DIAGNOSIS — R002 Palpitations: Secondary | ICD-10-CM | POA: Diagnosis not present

## 2020-01-11 DIAGNOSIS — R55 Syncope and collapse: Secondary | ICD-10-CM | POA: Diagnosis not present

## 2020-01-14 ENCOUNTER — Other Ambulatory Visit (HOSPITAL_COMMUNITY): Payer: Self-pay | Admitting: Family Medicine

## 2020-01-14 MED FILL — ROSUVASTATIN CALCIUM 5 MG T: 5 | 90 days supply | Qty: 90 | Fill #0

## 2020-01-19 DIAGNOSIS — M4722 Other spondylosis with radiculopathy, cervical region: Secondary | ICD-10-CM | POA: Diagnosis not present

## 2020-01-19 DIAGNOSIS — M810 Age-related osteoporosis without current pathological fracture: Secondary | ICD-10-CM | POA: Diagnosis not present

## 2020-01-19 DIAGNOSIS — E7800 Pure hypercholesterolemia, unspecified: Secondary | ICD-10-CM | POA: Diagnosis not present

## 2020-01-31 ENCOUNTER — Other Ambulatory Visit (HOSPITAL_COMMUNITY): Payer: Self-pay | Admitting: Sports Medicine

## 2020-01-31 DIAGNOSIS — M47816 Spondylosis without myelopathy or radiculopathy, lumbar region: Secondary | ICD-10-CM | POA: Diagnosis not present

## 2020-01-31 DIAGNOSIS — M545 Low back pain, unspecified: Secondary | ICD-10-CM | POA: Diagnosis not present

## 2020-01-31 DIAGNOSIS — M170 Bilateral primary osteoarthritis of knee: Secondary | ICD-10-CM | POA: Diagnosis not present

## 2020-01-31 MED FILL — GABAPENTIN 100 MG CAPSULE: 100 | 30 days supply | Qty: 90 | Fill #0

## 2020-02-06 MED FILL — CARVEDILOL 6.25 MG TABLET: 6.25 | 30 days supply | Qty: 60 | Fill #8

## 2020-02-10 DIAGNOSIS — M170 Bilateral primary osteoarthritis of knee: Secondary | ICD-10-CM | POA: Diagnosis not present

## 2020-02-10 DIAGNOSIS — M1711 Unilateral primary osteoarthritis, right knee: Secondary | ICD-10-CM | POA: Diagnosis not present

## 2020-03-06 ENCOUNTER — Other Ambulatory Visit (HOSPITAL_COMMUNITY): Payer: Self-pay | Admitting: Family Medicine

## 2020-03-06 MED FILL — FLUTICASONE PROP 50 MCG SPR: 50 | 60 days supply | Qty: 16 | Fill #3

## 2020-03-06 MED FILL — IBANDRONATE NA 150 MG TAB: 150 | 84 days supply | Qty: 3 | Fill #0

## 2020-04-06 ENCOUNTER — Other Ambulatory Visit: Payer: Self-pay

## 2020-04-06 ENCOUNTER — Ambulatory Visit
Admission: RE | Admit: 2020-04-06 | Discharge: 2020-04-06 | Disposition: A | Discharge status: Home / Self Care | Payer: PPO | Source: Ambulatory Visit | Attending: Family Medicine | Admitting: Family Medicine

## 2020-04-06 DIAGNOSIS — M85832 Other specified disorders of bone density and structure, left forearm: Secondary | ICD-10-CM | POA: Diagnosis not present

## 2020-04-06 DIAGNOSIS — Z780 Asymptomatic menopausal state: Secondary | ICD-10-CM | POA: Diagnosis not present

## 2020-04-06 DIAGNOSIS — M810 Age-related osteoporosis without current pathological fracture: Secondary | ICD-10-CM

## 2020-04-07 MED FILL — ROSUVASTATIN CALCIUM 5 MG T: 5 | 90 days supply | Qty: 90 | Fill #1

## 2020-04-07 MED FILL — CARVEDILOL 6.25 MG TABLET: 6.25 | 30 days supply | Qty: 60 | Fill #10

## 2020-04-10 DIAGNOSIS — R0789 Other chest pain: Secondary | ICD-10-CM | POA: Diagnosis not present

## 2020-04-12 DIAGNOSIS — I7090 Unspecified atherosclerosis: Secondary | ICD-10-CM | POA: Diagnosis not present

## 2020-04-12 DIAGNOSIS — E7800 Pure hypercholesterolemia, unspecified: Secondary | ICD-10-CM | POA: Diagnosis not present

## 2020-04-12 DIAGNOSIS — M4722 Other spondylosis with radiculopathy, cervical region: Secondary | ICD-10-CM | POA: Diagnosis not present

## 2020-04-12 DIAGNOSIS — K219 Gastro-esophageal reflux disease without esophagitis: Secondary | ICD-10-CM | POA: Diagnosis not present

## 2020-04-12 DIAGNOSIS — M810 Age-related osteoporosis without current pathological fracture: Secondary | ICD-10-CM | POA: Diagnosis not present

## 2020-05-08 MED FILL — CARVEDILOL 6.25 MG TABLET: 6.25 | 30 days supply | Qty: 60 | Fill #11

## 2020-05-23 DIAGNOSIS — R0789 Other chest pain: Secondary | ICD-10-CM | POA: Diagnosis not present

## 2020-05-24 DIAGNOSIS — R0789 Other chest pain: Secondary | ICD-10-CM | POA: Diagnosis not present

## 2020-05-25 ENCOUNTER — Other Ambulatory Visit: Payer: PPO

## 2020-05-25 DIAGNOSIS — Z20822 Contact with and (suspected) exposure to covid-19: Secondary | ICD-10-CM

## 2020-05-26 LAB — SARS-COV-2, NAA 2 DAY TAT

## 2020-05-26 LAB — NOVEL CORONAVIRUS, NAA: SARS-CoV-2, NAA: NOT DETECTED

## 2020-05-31 DIAGNOSIS — D1801 Hemangioma of skin and subcutaneous tissue: Secondary | ICD-10-CM | POA: Diagnosis not present

## 2020-05-31 DIAGNOSIS — D2272 Melanocytic nevi of left lower limb, including hip: Secondary | ICD-10-CM | POA: Diagnosis not present

## 2020-05-31 DIAGNOSIS — L821 Other seborrheic keratosis: Secondary | ICD-10-CM | POA: Diagnosis not present

## 2020-05-31 DIAGNOSIS — D2271 Melanocytic nevi of right lower limb, including hip: Secondary | ICD-10-CM | POA: Diagnosis not present

## 2020-05-31 DIAGNOSIS — D225 Melanocytic nevi of trunk: Secondary | ICD-10-CM | POA: Diagnosis not present

## 2020-05-31 DIAGNOSIS — D2262 Melanocytic nevi of left upper limb, including shoulder: Secondary | ICD-10-CM | POA: Diagnosis not present

## 2020-05-31 DIAGNOSIS — D2261 Melanocytic nevi of right upper limb, including shoulder: Secondary | ICD-10-CM | POA: Diagnosis not present

## 2020-05-31 DIAGNOSIS — D2372 Other benign neoplasm of skin of left lower limb, including hip: Secondary | ICD-10-CM | POA: Diagnosis not present

## 2020-05-31 DIAGNOSIS — L718 Other rosacea: Secondary | ICD-10-CM | POA: Diagnosis not present

## 2020-06-05 ENCOUNTER — Other Ambulatory Visit (HOSPITAL_COMMUNITY): Payer: Self-pay | Admitting: Family Medicine

## 2020-06-05 ENCOUNTER — Other Ambulatory Visit: Payer: Self-pay | Admitting: Cardiovascular Disease

## 2020-06-05 MED FILL — CARVEDILOL 6.25 MG TABLET: 6.25 | 30 days supply | Qty: 60 | Fill #0

## 2020-06-05 MED FILL — GABAPENTIN 100 MG CAPSULE: 100 | 30 days supply | Qty: 90 | Fill #1

## 2020-06-05 MED FILL — IBANDRONATE NA 150 MG TAB: 150 | 84 days supply | Qty: 3 | Fill #0

## 2020-06-21 ENCOUNTER — Other Ambulatory Visit (HOSPITAL_COMMUNITY): Payer: Self-pay | Admitting: Family Medicine

## 2020-06-21 MED FILL — FLUTICASONE PROP 50 MCG SPR: 50 | 60 days supply | Qty: 16 | Fill #0

## 2020-06-22 DIAGNOSIS — M4722 Other spondylosis with radiculopathy, cervical region: Secondary | ICD-10-CM | POA: Diagnosis not present

## 2020-06-22 DIAGNOSIS — M810 Age-related osteoporosis without current pathological fracture: Secondary | ICD-10-CM | POA: Diagnosis not present

## 2020-06-22 DIAGNOSIS — K219 Gastro-esophageal reflux disease without esophagitis: Secondary | ICD-10-CM | POA: Diagnosis not present

## 2020-06-22 DIAGNOSIS — E7800 Pure hypercholesterolemia, unspecified: Secondary | ICD-10-CM | POA: Diagnosis not present

## 2020-06-30 MED FILL — ROSUVASTATIN CALCIUM 5 MG T: 5 | 90 days supply | Qty: 90 | Fill #2

## 2020-07-03 MED FILL — CARVEDILOL 6.25 MG TABLET: 6.25 | 30 days supply | Qty: 60 | Fill #1

## 2020-07-18 DIAGNOSIS — E7800 Pure hypercholesterolemia, unspecified: Secondary | ICD-10-CM | POA: Diagnosis not present

## 2020-07-18 DIAGNOSIS — M810 Age-related osteoporosis without current pathological fracture: Secondary | ICD-10-CM | POA: Diagnosis not present

## 2020-07-18 DIAGNOSIS — K219 Gastro-esophageal reflux disease without esophagitis: Secondary | ICD-10-CM | POA: Diagnosis not present

## 2020-07-18 DIAGNOSIS — M4722 Other spondylosis with radiculopathy, cervical region: Secondary | ICD-10-CM | POA: Diagnosis not present

## 2020-07-25 DIAGNOSIS — M170 Bilateral primary osteoarthritis of knee: Secondary | ICD-10-CM | POA: Diagnosis not present

## 2020-08-01 DIAGNOSIS — M1712 Unilateral primary osteoarthritis, left knee: Secondary | ICD-10-CM | POA: Diagnosis not present

## 2020-08-01 DIAGNOSIS — M1711 Unilateral primary osteoarthritis, right knee: Secondary | ICD-10-CM | POA: Diagnosis not present

## 2020-08-01 DIAGNOSIS — M170 Bilateral primary osteoarthritis of knee: Secondary | ICD-10-CM | POA: Diagnosis not present

## 2020-08-02 ENCOUNTER — Other Ambulatory Visit (HOSPITAL_COMMUNITY): Payer: Self-pay

## 2020-08-02 MED FILL — Carvedilol Tab 6.25 MG: ORAL | 30 days supply | Qty: 60 | Fill #0 | Status: AC

## 2020-08-08 DIAGNOSIS — M170 Bilateral primary osteoarthritis of knee: Secondary | ICD-10-CM | POA: Diagnosis not present

## 2020-08-15 MED FILL — Fluticasone Propionate Nasal Susp 50 MCG/ACT: NASAL | 60 days supply | Qty: 16 | Fill #0 | Status: AC

## 2020-08-16 ENCOUNTER — Other Ambulatory Visit (HOSPITAL_COMMUNITY): Payer: Self-pay

## 2020-08-20 ENCOUNTER — Encounter: Payer: Self-pay | Admitting: Cardiovascular Disease

## 2020-08-20 NOTE — Progress Notes (Signed)
Cardiology Office Note:    Date:  08/21/2020   ID:  [name], [place] [birth year], MRN [id number]  PCP:  Kathyrn Lass, MD  Cardiologist:  Arieonna Medine  Electrophysiologist:  None   Referring MD: Kathyrn Lass, MD   Chief Complaint  Patient presents with  . Tachycardia       Daira Hine Wilfred Curtis is a 70 y.o. female with a hx of palpitations in the past.  We were asked to see her today by Dr. Leonides Schanz for further evaluation of these palpitations.  She has had palpitations for years. Wore a monitor for several weeks back in 2017  In Dec. Developed more palpitations Associated with a brief episode of lightheadedness.  Felt fatigued afterwards.  Has an occasional  "emptyness"  in the center of her chest  Works out regularly .   Has been going to [place] [place] in [place] quickly , recovers quickly   Has had hyperlipidemia. Coronary calcium score in Sept. 2019 was 0  Random episodes of chest pressure.   Not associated with exercise  May last for 5-10 minute Has been walking for year [place] farm .   Now raises sweet potatoes.  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  Non smoker.  Had pneumonia a year ago .  Labs from her primary medical doctor's office in December, 2020 reveals glucose of 80.  Creatinine is 0.66.  Sodium is 140.  Potassium is 5.0.  White blood cell count is 6.1.  Hemoglobin is 13.2.  TSH is 1.23.  She is reportedly had an x-ray at [facility] although I cannot find that report on the chart.  He had supposedly so it showed some nodules that are being followed by her primary medical doctor.  August 19, 2019:  Aubryanna is seen today for follow-up of her episodes of shortness of breath. Had a stress test on June 08, 2019.  She had no ST or T wave changes.  She did have a hypertensive response to exercise.  Her pulse oximetry was normal throughout the exercise test. Echocardiogram reveals hyperdynamic left ventricular function.  She has grade 1  diastolic dysfunction.  She was started on carvedilol in response to her hypertensive response to exercise. Thinks her DOE is improved.   Takes her pulse ox while walking.   O2 sats are normal .  Tolerating the coreg well   August 21, 2020: Wayne Brunker is seen for follow up of her DOE  Still exercising  Was in the hospital in Sept with lightheadedness.  Coronary CT angio in the hospital  Coronary calcium score of 0. Normal coronaries  +PFO .     Past Medical History:  Diagnosis Date  . Arthritis   . GERD (gastroesophageal reflux disease)   . History of colitis 09/06/2014  . History of kidney stones   . Hypoproteinemia ([place])   . Osteopenia   . Osteoporosis   . Pure hypercholesterolemia   . Renal cyst   . SVT (supraventricular tachycardia) ([facility])     Past Surgical History:  Procedure Laterality Date  . COLONOSCOPY  2006, 2016   normal  . LAPAROSCOPY Left 1989,& 90's   salpingoopherectomy cyst x2    Current Medications: Current Meds  Medication Sig  . acetaminophen (TYLENOL) 500 MG tablet Take 1,000 mg by mouth at bedtime.  Marland Kitchen aspirin EC 81 MG tablet Take 81 mg by mouth daily.  . Calcium Carbonate (CALCIUM 600  PO) Take 1 tablet by mouth daily.   . carvedilol (COREG) 6.25 MG tablet TAKE 1 TABLET BY MOUTH 2 TIMES DAILY. NEED ANNUAL OFFICE VISIT.  Marland Kitchen cetirizine (ZYRTEC) 10 MG tablet Take 10 mg by mouth daily.  . Cholecalciferol (VITAMIN D3) 1000 units CAPS Take 2,000 Units by mouth daily.  . Cyanocobalamin (VITAMIN B12) 500 MCG TABS Take 1 tablet by mouth daily.  . diclofenac Sodium (VOLTAREN) 1 % GEL Apply topically 4 (four) times daily.  . famotidine (PEPCID) 20 MG tablet Take 20 mg by mouth daily.  . fluticasone (FLONASE) 50 MCG/ACT nasal spray PLACE 1 SPRAY IN EACH NOSTRIL ONCE DAILY  . fluticasone (FLONASE) 50 MCG/ACT nasal spray USE 1 SPRAY IN EACH NOSTRIL ONCE A DAY  . gabapentin (NEURONTIN) 100 MG capsule TAKE 1 TO 3 CAPSULES BY MOUTH EVERY EVENING  . ibandronate  (BONIVA) 150 MG tablet TAKE 1 TABLET BY MOUTH ONCE A MONTH  . ibuprofen (ADVIL) 400 MG tablet Take 400 mg by mouth as needed.  Javier Docker Oil 500 MG CAPS   . Multiple Vitamin (MULTIVITAMIN) tablet Take 1 tablet by mouth daily.  Vladimir Faster Glycol-Propyl Glycol (SYSTANE FREE OP) Place 1 drop into both eyes daily as needed (Dry eyes).  Marland Kitchen PROAIR HFA 108 (90 Base) MCG/ACT inhaler Inhale 2 puffs into the lungs daily as needed for shortness of breath.   . Probiotic Product (PROBIOTIC DAILY PO) Take 1 tablet by mouth daily.  . rosuvastatin (CRESTOR) 5 MG tablet TAKE 1 TABLET BY MOUTH ONCE A DAY  . Turmeric 500 MG CAPS Take 1,000 mg by mouth daily.  . vitamin C (ASCORBIC ACID) 500 MG tablet Take 500 mg by mouth daily.  Marland Kitchen zinc gluconate 50 MG tablet Take 50 mg by mouth daily.     Allergies:   Patient has no known allergies.   Social History   Socioeconomic History  . Marital status: Married    Spouse name: Not on file  . Number of children: Not on file  . Years of education: Not on file  . Highest education level: Not on file  Occupational History  . Not on file  Tobacco Use  . Smoking status: Never Smoker  . Smokeless tobacco: Never Used  Substance and Sexual Activity  . Alcohol use: No  . Drug use: No  . Sexual activity: Not on file  Other Topics Concern  . 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[employer]   No children   3-4 caffeine drinks/day         Social Determinants of Radio broadcast assistant Strain: Not on file  Food Insecurity: Not on file  Transportation Needs: Not on file  Physical Activity: Not on file  Stress: Not on file  Social Connections: Not on file     Family History: The patient's family history includes Arthritis in her father and mother; Atrial fibrillation in her father and mother; Clotting disorder in her father; Heart attack in her father; Heart disease in her maternal grandfather; Hypertension in her father, maternal  grandfather, and mother; Pneumonia in her mother; Suicidality in her paternal grandmother. There is no history of Colon cancer.  ROS:   Please see the history of present illness.     All other systems reviewed and are negative.  EKGs/Labs/Other Studies Reviewed:    The following studies were reviewed today:   EKG:   Recent Labs: 12/30/2019: BUN 20; Creatinine, Ser 0.80; Hemoglobin  13.1; Platelets 228; Potassium 4.6; Sodium 138  Recent Lipid Panel No results found for: CHOL, TRIG, HDL, CHOLHDL, VLDL, LDLCALC, LDLDIRECT  Physical Exam: Blood pressure 130/62, pulse 64, height 5\' 5"  (1.651 m), weight 117 lb 3.2 oz (53.2 kg), SpO2 98 %.  GEN:  Well nourished, well developed in no acute distress HEENT: Normal NECK: No JVD; No carotid bruits LYMPHATICS: No lymphadenopathy CARDIAC: RRR  , no murmurs RESPIRATORY:  Clear to auscultation without rales, wheezing or rhonchi  ABDOMEN: Soft, non-tender, non-distended MUSCULOSKELETAL:  No edema; No deformity  SKIN: Warm and dry NEUROLOGIC:  Alert and oriented x 3    ASSESSMENT:    No diagnosis found. PLAN:    In order of problems listed above:  1.  Shortness of breath with exertion: Leonia Reader originally presented with shortness of breath with exertion.  She did well on a treadmill test.  She was found to have a hypertensive response to exercise.  Echocardiogram revealed hyperdynamic left ventricle.  We started her on carvedilol 6.25 mg twice a day.   She was hospitalized in September, 2021 for lightheadedness.  Coronary CT angiogram revealed a coronary calcium score of 0 and she has no coronary artery disease.  CT of the head was unremarkable.  At this point I really cannot find any new diagnosis for her.  She has chronic diastolic dysfunction.  Continue current dose of carvedilol.  I encouraged her to continue walking and try to work on some conditioning.  We will see her again in 1 year.     Medication Adjustments/Labs and Tests  Ordered: Current medicines are reviewed at length with the patient today.  Concerns regarding medicines are outlined above.  No orders of the defined types were placed in this encounter.  No orders of the defined types were placed in this encounter.   Patient Instructions  Medication Instructions:  Your physician recommends that you continue on your current medications as directed. Please refer to the Current Medication list given to you today.  *If you need a refill on your cardiac medications before your next appointment, please call your pharmacy*   Lab Work: none If you have labs (blood work) drawn today and your tests are completely normal, you will receive your results only by: Marland Kitchen MyChart Message (if you have MyChart) OR . A paper copy in the mail If you have any lab test that is abnormal or we need to change your treatment, we will call you to review the results.   Testing/Procedures: none   Follow-Up: At [facility], you and your health needs are our priority.  As part of our continuing mission to provide you with exceptional heart care, we have created designated Provider Care Teams.  These Care Teams include your primary Cardiologist (physician) and Advanced Practice Providers (APPs -  Physician Assistants and Nurse Practitioners) who all work together to provide you with the care you need, when you need it.  Your next appointment:   1 year(s)  The format for your next appointment:   In Person  Provider:   You may see Mertie Moores, MD or one of the following Advanced Practice Providers on your designated Care Team:    Richardson Dopp, PA-C  Robbie Lis, [place]       Signed, Mertie Moores, MD  08/21/2020 10:27 AM    [place]

## 2020-08-21 ENCOUNTER — Encounter: Payer: Self-pay | Admitting: Cardiovascular Disease

## 2020-08-21 ENCOUNTER — Ambulatory Visit: Payer: PPO | Admitting: Cardiovascular Disease

## 2020-08-21 ENCOUNTER — Other Ambulatory Visit: Payer: Self-pay

## 2020-08-21 DIAGNOSIS — I5032 Chronic diastolic (congestive) heart failure: Secondary | ICD-10-CM | POA: Insufficient documentation

## 2020-08-21 NOTE — Patient Instructions (Signed)

## 2020-08-22 ENCOUNTER — Other Ambulatory Visit (HOSPITAL_BASED_OUTPATIENT_CLINIC_OR_DEPARTMENT_OTHER): Payer: Self-pay

## 2020-08-22 ENCOUNTER — Ambulatory Visit: Payer: PPO | Attending: Internal Medicine

## 2020-08-22 DIAGNOSIS — Z23 Encounter for immunization: Secondary | ICD-10-CM

## 2020-08-22 MED ORDER — PFIZER-BIONT COVID-19 VAC-TRIS 30 MCG/0.3ML IM SUSP
INTRAMUSCULAR | 0 refills | Status: DC
  Filled 2020-08-22: qty 0.3, 1d supply, fill #0

## 2020-08-22 NOTE — Progress Notes (Signed)
   Covid-19 Vaccination Clinic  Name:  Sandra Park    MRN: [id number] DOB: [birth year]  08/22/2020  [name] was observed post Covid-19 immunization for 15 minutes without incident. She was provided with Vaccine Information Sheet and instruction to access the V-Safe system.   [name] was instructed to call 911 with any severe reactions post vaccine: Marland Kitchen Difficulty breathing  . Swelling of face and throat  . A fast heartbeat  . A bad rash all over body  . Dizziness and weakness   Immunizations Administered    Name Date Dose VIS Date Route   PFIZER Comrnaty(Gray TOP) Covid-19 Vaccine 08/22/2020 10:27 AM 0.3 mL 04/06/2020 Intramuscular   Manufacturer: [employer]   Lot: [id number]   NDC: [phone]

## 2020-08-28 ENCOUNTER — Other Ambulatory Visit: Payer: Self-pay | Admitting: Cardiovascular Disease

## 2020-08-28 ENCOUNTER — Other Ambulatory Visit (HOSPITAL_COMMUNITY): Payer: Self-pay

## 2020-08-28 MED ORDER — CARVEDILOL 6.25 MG PO TABS
ORAL_TABLET | ORAL | 2 refills | Status: DC
  Filled 2020-08-28 – 2020-09-05 (×2): qty 60, 30d supply, fill #0
  Filled 2020-09-26 – 2020-09-29 (×2): qty 60, 30d supply, fill #1
  Filled 2020-10-25: qty 60, 30d supply, fill #2
  Filled ????-??-??: fill #1

## 2020-09-04 ENCOUNTER — Other Ambulatory Visit (HOSPITAL_COMMUNITY): Payer: Self-pay

## 2020-09-05 ENCOUNTER — Other Ambulatory Visit (HOSPITAL_COMMUNITY): Payer: Self-pay

## 2020-09-07 DIAGNOSIS — M810 Age-related osteoporosis without current pathological fracture: Secondary | ICD-10-CM | POA: Diagnosis not present

## 2020-09-07 DIAGNOSIS — M4722 Other spondylosis with radiculopathy, cervical region: Secondary | ICD-10-CM | POA: Diagnosis not present

## 2020-09-07 DIAGNOSIS — E7800 Pure hypercholesterolemia, unspecified: Secondary | ICD-10-CM | POA: Diagnosis not present

## 2020-09-07 DIAGNOSIS — K219 Gastro-esophageal reflux disease without esophagitis: Secondary | ICD-10-CM | POA: Diagnosis not present

## 2020-09-13 DIAGNOSIS — M5459 Other low back pain: Secondary | ICD-10-CM | POA: Diagnosis not present

## 2020-09-20 ENCOUNTER — Other Ambulatory Visit: Payer: Self-pay | Admitting: Family Medicine

## 2020-09-20 DIAGNOSIS — Z1231 Encounter for screening mammogram for malignant neoplasm of breast: Secondary | ICD-10-CM

## 2020-09-26 ENCOUNTER — Other Ambulatory Visit (HOSPITAL_BASED_OUTPATIENT_CLINIC_OR_DEPARTMENT_OTHER): Payer: Self-pay

## 2020-09-26 ENCOUNTER — Other Ambulatory Visit (HOSPITAL_COMMUNITY): Payer: Self-pay

## 2020-09-26 MED FILL — Ibandronate Sodium Tab 150 MG (Base Equivalent): ORAL | 84 days supply | Qty: 3 | Fill #0 | Status: AC

## 2020-09-26 MED FILL — Rosuvastatin Calcium Tab 5 MG: ORAL | 90 days supply | Qty: 90 | Fill #0 | Status: AC

## 2020-09-29 ENCOUNTER — Other Ambulatory Visit (HOSPITAL_COMMUNITY): Payer: Self-pay

## 2020-10-07 DIAGNOSIS — M5416 Radiculopathy, lumbar region: Secondary | ICD-10-CM | POA: Diagnosis not present

## 2020-10-19 ENCOUNTER — Other Ambulatory Visit (HOSPITAL_COMMUNITY): Payer: Self-pay

## 2020-10-19 DIAGNOSIS — M47816 Spondylosis without myelopathy or radiculopathy, lumbar region: Secondary | ICD-10-CM | POA: Diagnosis not present

## 2020-10-19 MED ORDER — GABAPENTIN 100 MG PO CAPS
ORAL_CAPSULE | ORAL | 3 refills | Status: AC
  Filled 2020-10-19: qty 90, 30d supply, fill #0
  Filled 2021-02-18: qty 90, 30d supply, fill #1
  Filled 2021-06-20: qty 90, 30d supply, fill #2

## 2020-10-25 ENCOUNTER — Other Ambulatory Visit (HOSPITAL_COMMUNITY): Payer: Self-pay

## 2020-10-31 DIAGNOSIS — M4722 Other spondylosis with radiculopathy, cervical region: Secondary | ICD-10-CM | POA: Diagnosis not present

## 2020-10-31 DIAGNOSIS — M810 Age-related osteoporosis without current pathological fracture: Secondary | ICD-10-CM | POA: Diagnosis not present

## 2020-10-31 DIAGNOSIS — E7800 Pure hypercholesterolemia, unspecified: Secondary | ICD-10-CM | POA: Diagnosis not present

## 2020-10-31 DIAGNOSIS — K219 Gastro-esophageal reflux disease without esophagitis: Secondary | ICD-10-CM | POA: Diagnosis not present

## 2020-11-17 ENCOUNTER — Other Ambulatory Visit: Payer: Self-pay

## 2020-11-17 ENCOUNTER — Ambulatory Visit
Admission: RE | Admit: 2020-11-17 | Discharge: 2020-11-17 | Disposition: A | Discharge status: Home / Self Care | Payer: PPO | Source: Ambulatory Visit | Attending: Family Medicine | Admitting: Family Medicine

## 2020-11-17 DIAGNOSIS — Z1231 Encounter for screening mammogram for malignant neoplasm of breast: Secondary | ICD-10-CM | POA: Diagnosis not present

## 2020-11-30 ENCOUNTER — Other Ambulatory Visit (HOSPITAL_COMMUNITY): Payer: Self-pay

## 2020-11-30 ENCOUNTER — Other Ambulatory Visit: Payer: Self-pay | Admitting: Cardiovascular Disease

## 2020-11-30 MED ORDER — CARVEDILOL 6.25 MG PO TABS
ORAL_TABLET | ORAL | 2 refills | Status: DC
  Filled 2020-11-30: qty 60, 30d supply, fill #0
  Filled 2021-01-08: qty 60, 30d supply, fill #1
  Filled 2021-02-07: qty 60, 30d supply, fill #2

## 2020-11-30 MED FILL — Fluticasone Propionate Nasal Susp 50 MCG/ACT: NASAL | 60 days supply | Qty: 16 | Fill #1 | Status: AC

## 2020-12-06 ENCOUNTER — Encounter: Payer: Self-pay | Admitting: *Deleted

## 2020-12-12 ENCOUNTER — Other Ambulatory Visit: Payer: Self-pay

## 2020-12-12 ENCOUNTER — Ambulatory Visit: Payer: PPO | Admitting: Diagnostic Neuroimaging

## 2020-12-12 ENCOUNTER — Encounter: Payer: Self-pay | Admitting: Diagnostic Neuroimaging

## 2020-12-12 VITALS — BP 142/79 | HR 66 | Ht 65.0 in | Wt 116.0 lb

## 2020-12-12 DIAGNOSIS — M545 Low back pain, unspecified: Secondary | ICD-10-CM

## 2020-12-12 DIAGNOSIS — M79604 Pain in right leg: Secondary | ICD-10-CM | POA: Diagnosis not present

## 2020-12-12 DIAGNOSIS — R200 Anesthesia of skin: Secondary | ICD-10-CM | POA: Diagnosis not present

## 2020-12-12 DIAGNOSIS — M79605 Pain in left leg: Secondary | ICD-10-CM | POA: Diagnosis not present

## 2020-12-12 DIAGNOSIS — G8929 Other chronic pain: Secondary | ICD-10-CM

## 2020-12-12 NOTE — Patient Instructions (Signed)
LOW BACK PAIN / LEG PAIN (worse with standing, walking, bending) - likely related to lumbar spinal stenosis - improving with exercise and gabapentin - check neuropathy labs (rule out other causes)

## 2020-12-12 NOTE — Progress Notes (Signed)
[facility]  PATIENT: Sandra Park DOB: October 19, 1950  REFERRING CLINICIAN: Suella Broad, MD HISTORY FROM: patient  REASON FOR VISIT: new consult    HISTORICAL  CHIEF COMPLAINT:  Chief Complaint  Patient presents with   Pain    RM 6 alone Pt is well, has been having pain in lower extremities since fall of 2020 and noticed weakness has worsen. More so in L then R Also having numbness on the bottom of feet     HISTORY OF PRESENT ILLNESS:   70 year old female here for evaluation of lower extremity pain.  Symptoms started in fall 2020; she reports aching and sharp pain in her lower extremities from the knees down to her feet.  She also feels a paddling sensation on the bottom of her feet.  Has some lower back pain on the left side with burning sensation.  Symptoms are worse later in the day and after activities such as bending, walking or standing.  Symptoms were suggestive of lumbar spinal stenosis and MRI of the lumbar spine was obtained in June 2022 which confirmed this.  She has mild to moderate spinal stenosis at multiple levels.  This is advanced conservatively with exercise and gabapentin which is improving symptoms.  Patient referred here to rule out neuropathy or other causes of symptoms.     REVIEW OF SYSTEMS: Full 14 system review of systems performed and negative with exception of: As per HPI.  ALLERGIES: No Known Allergies  HOME MEDICATIONS: Outpatient Medications Prior to Visit  Medication Sig Dispense Refill   acetaminophen (TYLENOL) 500 MG tablet Take 1,000 mg by mouth at bedtime.     aspirin EC 81 MG tablet Take 81 mg by mouth daily.     Calcium Carbonate (CALCIUM 600 PO) Take 1 tablet by mouth daily.      carvedilol (COREG) 6.25 MG tablet TAKE 1 TABLET BY MOUTH 2 TIMES DAILY. NEED ANNUAL OFFICE VISIT. 60 tablet 2   cetirizine (ZYRTEC) 10 MG tablet Take 10 mg by mouth as needed.     Cholecalciferol (VITAMIN D3) 1000 units CAPS Take 2,000  Units by mouth daily.     COVID-19 mRNA Vac-TriS, Pfizer, (PFIZER-BIONT COVID-19 VAC-TRIS) SUSP injection Inject into the muscle. 0.3 mL 0   Cyanocobalamin (VITAMIN B12) 500 MCG TABS Take 1 tablet by mouth daily.     diclofenac Sodium (VOLTAREN) 1 % GEL Apply topically as needed.     famotidine (PEPCID) 20 MG tablet Take 20 mg by mouth daily.     fluticasone (FLONASE) 50 MCG/ACT nasal spray USE 1 SPRAY IN EACH NOSTRIL ONCE A DAY 16 g 2   gabapentin (NEURONTIN) 100 MG capsule TAKE 1 TO 3 CAPSULES BY MOUTH EVERY EVENING 90 capsule 3   ibandronate (BONIVA) 150 MG tablet TAKE 1 TABLET BY MOUTH ONCE A MONTH 3 tablet 2   ibuprofen (ADVIL) 400 MG tablet Take 400 mg by mouth as needed.     Krill Oil 500 MG CAPS      Multiple Vitamin (MULTIVITAMIN) tablet Take 1 tablet by mouth daily.     Polyethyl Glycol-Propyl Glycol (SYSTANE FREE OP) Place 1 drop into both eyes daily as needed (Dry eyes).     PROAIR HFA 108 (90 Base) MCG/ACT inhaler Inhale 2 puffs into the lungs daily as needed for shortness of breath.      Probiotic Product (PROBIOTIC DAILY PO) Take 1 tablet by mouth daily.     rosuvastatin (CRESTOR) 5 MG tablet TAKE 1 TABLET  BY MOUTH ONCE A DAY 90 tablet 3   Turmeric 500 MG CAPS Take 1,000 mg by mouth daily.     vitamin C (ASCORBIC ACID) 500 MG tablet Take 500 mg by mouth daily.     zinc gluconate 50 MG tablet Take 50 mg by mouth daily.     fluticasone (FLONASE) 50 MCG/ACT nasal spray PLACE 1 SPRAY IN EACH NOSTRIL ONCE DAILY 16 g 1   gabapentin (NEURONTIN) 100 MG capsule TAKE 1 TO 3 CAPSULES BY MOUTH EVERY EVENING 90 capsule 1   No facility-administered medications prior to visit.    PAST MEDICAL HISTORY: Past Medical History:  Diagnosis Date   Arthritis    Chronic back pain    GERD (gastroesophageal reflux disease)    History of colitis 09/06/2014   History of kidney stones    Hypoproteinemia (HCC)    Osteopenia    Osteoporosis    Pure hypercholesterolemia    Renal cyst    SVT  (supraventricular tachycardia) ([place])     PAST SURGICAL HISTORY: Past Surgical History:  Procedure Laterality Date   COLONOSCOPY  2006, 2016   normal   LAPAROSCOPY Left 1989,& 47's   salpingoopherectomy cyst x2    FAMILY HISTORY: Family History  Problem Relation Age of Onset   Atrial fibrillation Mother    Hypertension Mother    Arthritis Mother    Pneumonia Mother    Atrial fibrillation Father    Hypertension Father    Arthritis Father    Heart attack Father    Clotting disorder Father        DVT's   Suicidality Paternal Grandmother    Hypertension Maternal Grandfather    Heart disease Maternal Grandfather    Colon cancer Neg Hx     SOCIAL HISTORY: Social History   Socioeconomic History   Marital status: Divorced    Spouse name: Not on file   Number of children: 0   Years of education: Not on file   Highest education level: Not on file  Occupational History    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[employer]  Tobacco Use   Smoking status: Never   Smokeless tobacco: Never  Substance and Sexual Activity   Alcohol use: No   Drug use: No   Sexual activity: Not on file  Other Topics Concern   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[employer]   No children   3-4 caffeine drinks/day   Social Determinants of Radio broadcast assistant Strain: Not on file  Food Insecurity: Not on file  Transportation Needs: Not on file  Physical Activity: Not on file  Stress: Not on file  Social Connections: Not on file  Intimate Partner Violence: Not on file     PHYSICAL EXAM   GENERAL EXAM/CONSTITUTIONAL: Vitals:  Vitals:   12/12/20 1314  BP: (!) 142/79  Pulse: 66  Weight: 116 lb (52.6 kg)  Height: '5\' 5"'$  (1.651 m)   Body mass index is 19.3 kg/m. Wt Readings from Last 3 Encounters:  12/12/20 116 lb (52.6 kg)  08/21/20 117 lb 3.2 oz (53.2 kg)  12/31/19 111 lb 3.2 oz (50.4 kg)   Patient is in no distress; well developed, nourished and groomed;  neck is supple  CARDIOVASCULAR: Examination of carotid arteries is normal; no carotid bruits Regular rate and rhythm, no murmurs Examination of peripheral vascular system by observation and palpation is normal  EYES: Ophthalmoscopic exam of optic discs and posterior  segments is normal; no papilledema or hemorrhages No results found.  MUSCULOSKELETAL: Gait, strength, tone, movements noted in Neurologic exam below  NEUROLOGIC: MENTAL STATUS:  No flowsheet data found. awake, alert, oriented to person, place and time recent and remote memory intact normal attention and concentration language fluent, comprehension intact, naming intact fund of knowledge appropriate  CRANIAL NERVE:  2nd - no papilledema on fundoscopic exam 2nd, 3rd, 4th, 6th - pupils equal and reactive to light, visual fields full to confrontation, extraocular muscles intact, no nystagmus 5th - facial sensation symmetric 7th - facial strength symmetric 8th - hearing intact 9th - palate elevates symmetrically, uvula midline 11th - shoulder shrug symmetric 12th - tongue protrusion midline  MOTOR:  normal bulk and tone, full strength in the BUE, BLE  SENSORY:  normal and symmetric to light touch, pinprick, temperature, vibration; EXCEPT DECR VIB IN FEET  COORDINATION:  finger-nose-finger, fine finger movements normal  REFLEXES:  deep tendon reflexes -> BUE 1, BLE trace  GAIT/STATION:  narrow based gait     DIAGNOSTIC DATA (LABS, IMAGING, TESTING) - I reviewed patient records, labs, notes, testing and imaging myself where available.  Lab Results  Component Value Date   WBC 4.6 12/30/2019   HGB 13.1 12/30/2019   HCT 41.2 12/30/2019   MCV 95.8 12/30/2019   PLT 228 12/30/2019      Component Value Date/Time   NA 138 12/30/2019 0744   K 4.6 12/30/2019 0744   CL 102 12/30/2019 0744   CO2 26 12/30/2019 0744   GLUCOSE 97 12/30/2019 0744   BUN 20 12/30/2019 0744   CREATININE 0.80 12/30/2019 0744    CALCIUM 10.3 12/30/2019 0744   GFRNONAA >60 12/30/2019 0744   GFRAA >60 12/30/2019 0744   No results found for: CHOL, HDL, LDLCALC, LDLDIRECT, TRIG, CHOLHDL No results found for: HGBA1C No results found for: VITAMINB12 Lab Results  Component Value Date   TSH 1.330 08/19/2019    10/07/20 MRI lumbar spine -Moderate and severe levoscoliosis L2-3; multilevel degenerative changes and foraminal stenosis  -L2-3 mild central stenosis -L3-4 mild to moderate central stenosis -L4-5 mild to moderate central stenosis    ASSESSMENT AND PLAN  70 y.o. year old female here with low back pain, lower extremity pain, worse with activity since fall 2020.  Most like represents lumbar spinal stenosis and degenerative changes.  Underlying distal neuropathy is possible and therefore we will check lab testing to rule out other secondary causes.  Symptoms slightly improving with conservative management including exercise and gabapentin.  If symptoms significantly worsen then we may consider EMG nerve conduction study in the future.   Dx:  1. Pain in both lower extremities   2. Numbness in feet   3. Chronic left-sided low back pain without sciatica      PLAN:  LOW BACK PAIN / LEG PAIN (worse with standing, walking, bending; sounds more likely related to lumbar spinal stenosis; neuropathy less likely) - improving with exercise and gabapentin - check neuropathy labs (rule out other causes)  Orders Placed This Encounter  Procedures   CBC with diff   CMP   Vitamin B12   A1c   TSH   SPEP with IFE   ANA w/Reflex   SSA, SSB   Return for pending if symptoms worsen or fail to improve, pending test results.    Penni Bombard, MD [id number], [id number] PM Certified in Neurology, Neurophysiology and Neuroimaging  [facility] [address], [place], [place] [phone]

## 2020-12-17 LAB — CBC WITH DIFFERENTIAL/PLATELET
Basophils Absolute: 0 10*3/uL (ref 0.0–0.2)
Basos: 1 %
EOS (ABSOLUTE): 0.2 10*3/uL (ref 0.0–0.4)
Eos: 4 %
Hematocrit: 37.4 % (ref 34.0–46.6)
Hemoglobin: 12.4 g/dL (ref 11.1–15.9)
Immature Grans (Abs): 0 10*3/uL (ref 0.0–0.1)
Immature Granulocytes: 0 %
Lymphocytes Absolute: 1 10*3/uL (ref 0.7–3.1)
Lymphs: 21 %
MCH: 30.7 pg (ref 26.6–33.0)
MCHC: 33.2 g/dL (ref 31.5–35.7)
MCV: 93 fL (ref 79–97)
Monocytes Absolute: 0.3 10*3/uL (ref 0.1–0.9)
Monocytes: 7 %
Neutrophils Absolute: 3 10*3/uL (ref 1.4–7.0)
Neutrophils: 67 %
Platelets: 168 10*3/uL (ref 150–450)
RBC: 4.04 x10E6/uL (ref 3.77–5.28)
RDW: 11.9 % (ref 11.7–15.4)
WBC: 4.6 10*3/uL (ref 3.4–10.8)

## 2020-12-17 LAB — COMPREHENSIVE METABOLIC PANEL
ALT: 19 IU/L (ref 0–32)
AST: 17 IU/L (ref 0–40)
Albumin/Globulin Ratio: 2.8 — ABNORMAL HIGH (ref 1.2–2.2)
Albumin: 4.5 g/dL (ref 3.7–4.7)
Alkaline Phosphatase: 61 IU/L (ref 44–121)
BUN/Creatinine Ratio: 31 — ABNORMAL HIGH (ref 12–28)
BUN: 22 mg/dL (ref 8–27)
Bilirubin Total: 0.2 mg/dL (ref 0.0–1.2)
CO2: 24 mmol/L (ref 20–29)
Calcium: 9.9 mg/dL (ref 8.7–10.3)
Chloride: 105 mmol/L (ref 96–106)
Creatinine, Ser: 0.72 mg/dL (ref 0.57–1.00)
Globulin, Total: 1.6 g/dL (ref 1.5–4.5)
Glucose: 128 mg/dL — ABNORMAL HIGH (ref 65–99)
Potassium: 4.2 mmol/L (ref 3.5–5.2)
Sodium: 142 mmol/L (ref 134–144)
Total Protein: 6.1 g/dL (ref 6.0–8.5)
eGFR: 89 mL/min/{1.73_m2} (ref >59)

## 2020-12-17 LAB — TSH: TSH: 0.777 u[IU]/mL (ref 0.450–4.500)

## 2020-12-17 LAB — HEMOGLOBIN A1C
Est. average glucose Bld gHb Est-mCnc: 120 mg/dL
Hgb A1c MFr Bld: 5.8 % — ABNORMAL HIGH (ref 4.8–5.6)

## 2020-12-17 LAB — MULTIPLE MYELOMA PANEL, SERUM
Albumin SerPl Elph-Mcnc: 3.8 g/dL (ref 2.9–4.4)
Albumin/Glob SerPl: 1.7 (ref 0.7–1.7)
Alpha 1: 0.2 g/dL (ref 0.0–0.4)
Alpha2 Glob SerPl Elph-Mcnc: 0.6 g/dL (ref 0.4–1.0)
B-Globulin SerPl Elph-Mcnc: 0.9 g/dL (ref 0.7–1.3)
Gamma Glob SerPl Elph-Mcnc: 0.6 g/dL (ref 0.4–1.8)
Globulin, Total: 2.3 g/dL (ref 2.2–3.9)
IgA/Immunoglobulin A, Serum: 133 mg/dL (ref 64–422)
IgG (Immunoglobin G), Serum: 673 mg/dL (ref 586–1602)
IgM (Immunoglobulin M), Srm: 77 mg/dL (ref 26–217)

## 2020-12-17 LAB — VITAMIN B12: Vitamin B-12: >2000 pg/mL — ABNORMAL HIGH (ref 232–1245)

## 2020-12-17 LAB — SJOGREN'S SYNDROME ANTIBODS(SSA + SSB)
ENA SSA (RO) Ab: <0.2 AI (ref 0.0–0.9)
ENA SSB (LA) Ab: <0.2 AI (ref 0.0–0.9)

## 2020-12-20 LAB — ANA W/REFLEX: ANA Titer 1: NEGATIVE

## 2020-12-26 DIAGNOSIS — H2513 Age-related nuclear cataract, bilateral: Secondary | ICD-10-CM | POA: Diagnosis not present

## 2020-12-26 DIAGNOSIS — H1789 Other corneal scars and opacities: Secondary | ICD-10-CM | POA: Diagnosis not present

## 2020-12-26 DIAGNOSIS — H52203 Unspecified astigmatism, bilateral: Secondary | ICD-10-CM | POA: Diagnosis not present

## 2020-12-26 DIAGNOSIS — H524 Presbyopia: Secondary | ICD-10-CM | POA: Diagnosis not present

## 2020-12-29 DIAGNOSIS — M810 Age-related osteoporosis without current pathological fracture: Secondary | ICD-10-CM | POA: Diagnosis not present

## 2020-12-29 DIAGNOSIS — E7800 Pure hypercholesterolemia, unspecified: Secondary | ICD-10-CM | POA: Diagnosis not present

## 2020-12-29 DIAGNOSIS — K219 Gastro-esophageal reflux disease without esophagitis: Secondary | ICD-10-CM | POA: Diagnosis not present

## 2020-12-29 DIAGNOSIS — Z23 Encounter for immunization: Secondary | ICD-10-CM | POA: Diagnosis not present

## 2020-12-29 DIAGNOSIS — M47816 Spondylosis without myelopathy or radiculopathy, lumbar region: Secondary | ICD-10-CM | POA: Diagnosis not present

## 2020-12-29 DIAGNOSIS — I7090 Unspecified atherosclerosis: Secondary | ICD-10-CM | POA: Diagnosis not present

## 2021-01-03 DIAGNOSIS — Z0000 Encounter for general adult medical examination without abnormal findings: Secondary | ICD-10-CM | POA: Diagnosis not present

## 2021-01-08 ENCOUNTER — Other Ambulatory Visit (HOSPITAL_COMMUNITY): Payer: Self-pay

## 2021-01-12 ENCOUNTER — Other Ambulatory Visit (HOSPITAL_COMMUNITY): Payer: Self-pay

## 2021-01-15 ENCOUNTER — Other Ambulatory Visit (HOSPITAL_COMMUNITY): Payer: Self-pay

## 2021-01-15 MED ORDER — ROSUVASTATIN CALCIUM 5 MG PO TABS
ORAL_TABLET | ORAL | 3 refills | Status: DC
  Filled 2021-01-15: qty 90, 90d supply, fill #0
  Filled 2021-04-09: qty 90, 90d supply, fill #1
  Filled 2021-07-03: qty 90, 90d supply, fill #2
  Filled 2021-10-08: qty 90, 90d supply, fill #3

## 2021-01-19 DIAGNOSIS — M4722 Other spondylosis with radiculopathy, cervical region: Secondary | ICD-10-CM | POA: Diagnosis not present

## 2021-01-19 DIAGNOSIS — E7800 Pure hypercholesterolemia, unspecified: Secondary | ICD-10-CM | POA: Diagnosis not present

## 2021-01-19 DIAGNOSIS — M47816 Spondylosis without myelopathy or radiculopathy, lumbar region: Secondary | ICD-10-CM | POA: Diagnosis not present

## 2021-01-19 DIAGNOSIS — M810 Age-related osteoporosis without current pathological fracture: Secondary | ICD-10-CM | POA: Diagnosis not present

## 2021-01-19 DIAGNOSIS — K219 Gastro-esophageal reflux disease without esophagitis: Secondary | ICD-10-CM | POA: Diagnosis not present

## 2021-01-25 DIAGNOSIS — M5416 Radiculopathy, lumbar region: Secondary | ICD-10-CM | POA: Diagnosis not present

## 2021-02-07 ENCOUNTER — Other Ambulatory Visit (HOSPITAL_COMMUNITY): Payer: Self-pay

## 2021-02-18 MED FILL — Ibandronate Sodium Tab 150 MG (Base Equivalent): ORAL | 84 days supply | Qty: 3 | Fill #1 | Status: AC

## 2021-02-19 ENCOUNTER — Other Ambulatory Visit (HOSPITAL_COMMUNITY): Payer: Self-pay

## 2021-03-09 ENCOUNTER — Other Ambulatory Visit (HOSPITAL_COMMUNITY): Payer: Self-pay

## 2021-03-09 ENCOUNTER — Other Ambulatory Visit: Payer: Self-pay | Admitting: Cardiovascular Disease

## 2021-03-09 MED ORDER — CARVEDILOL 6.25 MG PO TABS
6.2500 mg | ORAL_TABLET | Freq: Two times a day (BID) | ORAL | 1 refills | Status: DC
Start: 2021-03-09 — End: 2021-09-07
  Filled 2021-03-09: qty 180, 90d supply, fill #0
  Filled 2021-06-08: qty 180, 90d supply, fill #1

## 2021-03-16 DIAGNOSIS — M7741 Metatarsalgia, right foot: Secondary | ICD-10-CM | POA: Diagnosis not present

## 2021-03-16 DIAGNOSIS — M79671 Pain in right foot: Secondary | ICD-10-CM | POA: Diagnosis not present

## 2021-03-25 ENCOUNTER — Other Ambulatory Visit (HOSPITAL_COMMUNITY): Payer: Self-pay

## 2021-03-26 ENCOUNTER — Other Ambulatory Visit (HOSPITAL_COMMUNITY): Payer: Self-pay

## 2021-03-26 MED ORDER — FLUTICASONE PROPIONATE 50 MCG/ACT NA SUSP
NASAL | 3 refills | Status: DC
  Filled 2021-03-26: qty 16, 60d supply, fill #0
  Filled 2021-05-28: qty 16, 60d supply, fill #1
  Filled 2021-08-06: qty 16, 60d supply, fill #2
  Filled 2021-10-08: qty 16, 60d supply, fill #3

## 2021-04-10 ENCOUNTER — Other Ambulatory Visit (HOSPITAL_COMMUNITY): Payer: Self-pay

## 2021-04-13 ENCOUNTER — Ambulatory Visit: Payer: PPO | Attending: Internal Medicine

## 2021-04-13 ENCOUNTER — Other Ambulatory Visit: Payer: Self-pay

## 2021-04-13 ENCOUNTER — Other Ambulatory Visit (HOSPITAL_BASED_OUTPATIENT_CLINIC_OR_DEPARTMENT_OTHER): Payer: Self-pay

## 2021-04-13 DIAGNOSIS — Z23 Encounter for immunization: Secondary | ICD-10-CM

## 2021-04-13 MED ORDER — PFIZER COVID-19 VAC BIVALENT 30 MCG/0.3ML IM SUSP
INTRAMUSCULAR | 0 refills | Status: DC
  Filled 2021-04-13: qty 0.3, 1d supply, fill #0

## 2021-04-13 NOTE — Progress Notes (Signed)
° °  Covid-19 Vaccination Clinic  Name:  Sandra Park    MRN: [id number] DOB: 1951/01/17  04/13/2021  Ms. Sandra Park was observed post Covid-19 immunization for 15 minutes without incident. She was provided with Vaccine Information Sheet and instruction to access the V-Safe system.   Ms. Sandra Park was instructed to call 911 with any severe reactions post vaccine: Difficulty breathing  Swelling of face and throat  A fast heartbeat  A bad rash all over body  Dizziness and weakness   Immunizations Administered     Name Date Dose VIS Date Route   Pfizer Covid-19 Vaccine Bivalent Booster 04/13/2021  2:35 PM 0.3 mL 12/27/2020 Intramuscular   Manufacturer: [place]   Lot: [id number]   [place]: [phone]

## 2021-05-15 ENCOUNTER — Other Ambulatory Visit (HOSPITAL_COMMUNITY): Payer: Self-pay

## 2021-05-15 DIAGNOSIS — R051 Acute cough: Secondary | ICD-10-CM | POA: Diagnosis not present

## 2021-05-15 DIAGNOSIS — J018 Other acute sinusitis: Secondary | ICD-10-CM | POA: Diagnosis not present

## 2021-05-15 MED ORDER — AMOXICILLIN-POT CLAVULANATE 875-125 MG PO TABS
ORAL_TABLET | ORAL | 0 refills | Status: DC
  Filled 2021-05-15: qty 14, 7d supply, fill #0

## 2021-05-20 DIAGNOSIS — R058 Other specified cough: Secondary | ICD-10-CM | POA: Diagnosis not present

## 2021-05-20 DIAGNOSIS — J22 Unspecified acute lower respiratory infection: Secondary | ICD-10-CM | POA: Diagnosis not present

## 2021-05-28 ENCOUNTER — Other Ambulatory Visit (HOSPITAL_COMMUNITY): Payer: Self-pay

## 2021-05-28 MED ORDER — ALBUTEROL SULFATE HFA 108 (90 BASE) MCG/ACT IN AERS
INHALATION_SPRAY | RESPIRATORY_TRACT | 0 refills | Status: DC
  Filled 2021-05-28: qty 18, 30d supply, fill #0

## 2021-05-30 DIAGNOSIS — R197 Diarrhea, unspecified: Secondary | ICD-10-CM | POA: Diagnosis not present

## 2021-05-30 DIAGNOSIS — K921 Melena: Secondary | ICD-10-CM | POA: Diagnosis not present

## 2021-05-31 DIAGNOSIS — K921 Melena: Secondary | ICD-10-CM | POA: Diagnosis not present

## 2021-05-31 DIAGNOSIS — R197 Diarrhea, unspecified: Secondary | ICD-10-CM | POA: Diagnosis not present

## 2021-06-05 DIAGNOSIS — A0472 Enterocolitis due to Clostridium difficile, not specified as recurrent: Secondary | ICD-10-CM | POA: Diagnosis not present

## 2021-06-08 ENCOUNTER — Other Ambulatory Visit (HOSPITAL_COMMUNITY): Payer: Self-pay

## 2021-06-14 ENCOUNTER — Other Ambulatory Visit (HOSPITAL_COMMUNITY): Payer: Self-pay

## 2021-06-14 DIAGNOSIS — A0472 Enterocolitis due to Clostridium difficile, not specified as recurrent: Secondary | ICD-10-CM | POA: Diagnosis not present

## 2021-06-14 MED ORDER — ONDANSETRON 4 MG PO TBDP
ORAL_TABLET | ORAL | 0 refills | Status: DC
  Filled 2021-06-14: qty 14, 14d supply, fill #0

## 2021-06-18 DIAGNOSIS — D649 Anemia, unspecified: Secondary | ICD-10-CM | POA: Diagnosis not present

## 2021-06-18 DIAGNOSIS — R748 Abnormal levels of other serum enzymes: Secondary | ICD-10-CM | POA: Diagnosis not present

## 2021-06-18 DIAGNOSIS — R197 Diarrhea, unspecified: Secondary | ICD-10-CM | POA: Diagnosis not present

## 2021-06-19 DIAGNOSIS — R197 Diarrhea, unspecified: Secondary | ICD-10-CM | POA: Diagnosis not present

## 2021-06-20 ENCOUNTER — Other Ambulatory Visit (HOSPITAL_COMMUNITY): Payer: Self-pay

## 2021-06-20 MED ORDER — IBANDRONATE SODIUM 150 MG PO TABS
ORAL_TABLET | ORAL | 2 refills | Status: DC
  Filled 2021-06-20: qty 3, 90d supply, fill #0

## 2021-06-21 ENCOUNTER — Other Ambulatory Visit (HOSPITAL_COMMUNITY): Payer: Self-pay

## 2021-06-21 MED ORDER — METRONIDAZOLE 500 MG PO TABS
ORAL_TABLET | ORAL | 0 refills | Status: DC
  Filled 2021-06-21: qty 30, 10d supply, fill #0

## 2021-07-03 ENCOUNTER — Other Ambulatory Visit (HOSPITAL_COMMUNITY): Payer: Self-pay

## 2021-07-24 DIAGNOSIS — R748 Abnormal levels of other serum enzymes: Secondary | ICD-10-CM | POA: Diagnosis not present

## 2021-08-01 DIAGNOSIS — D692 Other nonthrombocytopenic purpura: Secondary | ICD-10-CM | POA: Diagnosis not present

## 2021-08-01 DIAGNOSIS — D2272 Melanocytic nevi of left lower limb, including hip: Secondary | ICD-10-CM | POA: Diagnosis not present

## 2021-08-01 DIAGNOSIS — D2372 Other benign neoplasm of skin of left lower limb, including hip: Secondary | ICD-10-CM | POA: Diagnosis not present

## 2021-08-01 DIAGNOSIS — D1801 Hemangioma of skin and subcutaneous tissue: Secondary | ICD-10-CM | POA: Diagnosis not present

## 2021-08-01 DIAGNOSIS — D485 Neoplasm of uncertain behavior of skin: Secondary | ICD-10-CM | POA: Diagnosis not present

## 2021-08-01 DIAGNOSIS — D3617 Benign neoplasm of peripheral nerves and autonomic nervous system of trunk, unspecified: Secondary | ICD-10-CM | POA: Diagnosis not present

## 2021-08-01 DIAGNOSIS — L821 Other seborrheic keratosis: Secondary | ICD-10-CM | POA: Diagnosis not present

## 2021-08-01 DIAGNOSIS — L814 Other melanin hyperpigmentation: Secondary | ICD-10-CM | POA: Diagnosis not present

## 2021-08-01 DIAGNOSIS — D3611 Benign neoplasm of peripheral nerves and autonomic nervous system of face, head, and neck: Secondary | ICD-10-CM | POA: Diagnosis not present

## 2021-08-01 DIAGNOSIS — D225 Melanocytic nevi of trunk: Secondary | ICD-10-CM | POA: Diagnosis not present

## 2021-08-06 ENCOUNTER — Other Ambulatory Visit (HOSPITAL_COMMUNITY): Payer: Self-pay

## 2021-08-23 ENCOUNTER — Ambulatory Visit: Payer: PPO | Admitting: Cardiovascular Disease

## 2021-08-23 ENCOUNTER — Encounter: Payer: Self-pay | Admitting: Cardiovascular Disease

## 2021-08-23 VITALS — BP 128/74 | HR 59 | Ht 65.0 in | Wt 113.0 lb

## 2021-08-23 DIAGNOSIS — I6523 Occlusion and stenosis of bilateral carotid arteries: Secondary | ICD-10-CM

## 2021-08-23 DIAGNOSIS — I5032 Chronic diastolic (congestive) heart failure: Secondary | ICD-10-CM | POA: Diagnosis not present

## 2021-08-23 NOTE — Progress Notes (Signed)
?Cardiology Office Note:   ? ?Date:  08/23/2021  ? ?ID:  [place], [place] 10/03/1950, MRN [id number] ? ?PCP:  Kathyrn Lass, MD  ?Cardiologist:  Qaadir Kent  ?Electrophysiologist:  None  ? ?Referring MD: Kathyrn Lass, MD  ? ?Chief Complaint  ?Patient presents with  ? Congestive Heart Failure  ?   ?  ? ? ?  ? ?Sandra Park is a 71 y.o. female with a hx of palpitations in the past.  We were asked to see her today by Dr. Leonides Schanz for further evaluation of these palpitations. ? ?She has had palpitations for years. ?Wore a monitor for several weeks back in 2017 ? ?In Dec. Developed more palpitations ?Associated with a brief episode of lightheadedness.  ?Felt fatigued afterwards.  ?Has an occasional  "emptyness"  in the center of her chest  ?Works out regularly .   [place] going to [place] in [place]  ?Walks quickly , recovers quickly  ? ?Has had hyperlipidemia. ?Coronary calcium score in Sept. 2019 was 0 ? ?Random episodes of chest pressure.   Not associated with exercise  ?May last for 5-10 minute ?Has been walking for year ?[place] farm .   Now raises sweet potatoes.  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  ?Non smoker.  Had pneumonia a year ago .  Labs from her primary medical doctor's office in December, 2020 reveals glucose of 80.  Creatinine is 0.66.  Sodium is 140.  Potassium is 5.0.  White blood cell count is 6.1.  Hemoglobin is 13.2.  TSH is 1.23. ? ?She is reportedly had an x-ray at [facility] although I cannot find that report on the chart.  He had supposedly so it showed some nodules that are being followed by her primary medical doctor. ? ?August 19, 2019: ? ?Sandra Park is seen today for follow-up of her episodes of shortness of breath. ?Had a stress test on June 08, 2019.  She had no ST or T wave changes.  She did have a hypertensive response to exercise.  Her pulse oximetry was normal throughout the exercise test. ?Echocardiogram reveals hyperdynamic left ventricular  function.  She has grade 1 diastolic dysfunction.  She was started on carvedilol in response to her hypertensive response to exercise. ?Thinks her DOE is improved.  ? ?Takes her pulse ox while walking.   O2 sats are normal .  Tolerating the coreg well  ? ?August 21, 2020: ?Sandra Park is seen for follow up of her DOE  ?Still exercising  ?Was in the hospital in Sept with lightheadedness.  ?Coronary CT angio in the hospital  ?Coronary calcium score of 0. ?Normal coronaries  ?+PFO .  ? 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 ?Had C.diff,  was on oral vancomycin ? Then a 2nd course of abx - flagyl the 2nd time  ?Breathing is ok ?No doe,  is exercising regulalry ,  ?Takes on coreg  ? ?Was started on crestor after being found to have an incidental diagnosis of carotid artery disease/carotid artery calcifications ? ?She is on low-dose aspirin 81 mg a day for for her carotid artery calcifications. ? ?Past Medical History:  ?  Diagnosis Date  ? Arthritis   ? Chronic back pain   ? GERD (gastroesophageal reflux disease)   ? History of colitis 09/06/2014  ? History of kidney stones   ? Hypoproteinemia ([place])   ? Osteopenia   ? Osteoporosis   ? Pure hypercholesterolemia   ? Renal cyst   ? SVT (supraventricular tachycardia) (HCC)   ? ? ?Past Surgical History:  ?Procedure Laterality Date  ? COLONOSCOPY  2006, 2016  ? normal  ? LAPAROSCOPY Left 1989,& 90's  ? salpingoopherectomy cyst x2  ? ? ?Current Medications: ?Current Meds  ?Medication Sig  ? acetaminophen (TYLENOL) 500 MG tablet Take 1,000 mg by mouth at bedtime.  ? Calcium Carbonate (CALCIUM 600 PO) Take 1 tablet by mouth daily.   ? carvedilol (COREG) 6.25 MG tablet Take 1 tablet by mouth 2 (two) times daily with a meal.  ? cetirizine (ZYRTEC) 10 MG  tablet Take 10 mg by mouth as needed.  ? Cholecalciferol (VITAMIN D3) 1000 units CAPS Take 2,000 Units by mouth daily.  ? Cyanocobalamin (VITAMIN B12) 500 MCG TABS Take 1 tablet by mouth daily.  ? diclofenac Sodium (VOLTAREN) 1 % GEL Apply topically as needed.  ? famotidine (PEPCID) 20 MG tablet Take 20 mg by mouth daily as needed.  ? fluticasone (FLONASE) 50 MCG/ACT nasal spray PLACE 1 SPRAY IN EACH NOSTRIL ONCE DAILY  ? gabapentin (NEURONTIN) 100 MG capsule TAKE 1 TO 3 CAPSULES BY MOUTH EVERY EVENING  ? ibuprofen (ADVIL) 400 MG tablet Take 400 mg by mouth as needed.  ? Krill Oil 500 MG CAPS   ? Multiple Vitamin (MULTIVITAMIN) tablet Take 1 tablet by mouth daily.  ? Polyethyl Glycol-Propyl Glycol (SYSTANE FREE OP) Place 1 drop into both eyes daily as needed (Dry eyes).  ? PROAIR HFA 108 (90 Base) MCG/ACT inhaler Inhale 2 puffs into the lungs daily as needed for shortness of breath.   ? Probiotic Product (PROBIOTIC DAILY PO) Take 3 tablets by mouth daily.  ? rosuvastatin (CRESTOR) 5 MG tablet Take 1 tablet by mouth once a day.  ? Turmeric 500 MG CAPS Take 1,000 mg by mouth daily.  ?  ? ?Allergies:   Augmentin [amoxicillin-pot clavulanate] and Levofloxacin  ? ?Social History  ? ?Socioeconomic History  ? Marital status: Divorced  ?  Spouse name: Not on file  ? Number of children: 0  ? Years of education: Not on file  ? Highest education level: Not on file  ?Occupational History  ?  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 Smoking status: Never  ? Smokeless tobacco: Never  ?Substance and Sexual Activity  ? Alcohol use: No  ? Drug use: No  ? Sexual activity: Not on file  ?Other Topics Concern  ? Not on file  ?Social History Narrative  ? Separated  ? RN - [employer]  ? No children  ? 3-4 caffeine drinks/day  ? ?Social Determinants of Health  ? ?Financial Resource Strain: Not on file  ?Food Insecurity: Not on file  ?Transportation Needs: Not on file  ?Physical Activity: Not on file  ?Stress: Not on file   ?Social Connections: Not on file  ?  ? ?Family History: ?The patient's family history includes Arthritis in her father and mother; Atrial fibrillation in her father and mother; Clotting disorder in her father; Heart attack in her father; Heart disease in her maternal grandfather; Hypertension in her father, maternal grandfather, and mother; Pneumonia in her mother; Suicidality in her paternal  grandmother. There is no history of Colon cancer. ? ?ROS:   ?Please see the history of present illness.    ? All other systems reviewed and are negative. ? ?EKGs/Labs/Other Studies Reviewed:   ? ?The following studies were reviewed today: ? ? ?EKG: August 23, 2021: Sinus bradycardia 59.  Nonspecific ST and T wave abnormalities. ? ?Recent Labs: ?12/12/2020: ALT 19; BUN 22; Creatinine, Ser 0.72; Hemoglobin 12.4; Platelets 168; Potassium 4.2; Sodium 142; TSH 0.777  ?Recent Lipid Panel ?No results found for: CHOL, TRIG, HDL, CHOLHDL, VLDL, LDLCALC, LDLDIRECT ? ?Physical Exam: ?Blood pressure 128/74, pulse (!) 59, height '5\' 5"'$  (1.651 m), weight 113 lb (51.3 kg), SpO2 99 %. ? ?GEN:  Well nourished, well developed in no acute distress ?HEENT: Normal ?NECK: No JVD; No carotid bruits ?LYMPHATICS: No lymphadenopathy ?CARDIAC: RRR , no murmurs, rubs, gallops ?RESPIRATORY:  Clear to auscultation without rales, wheezing or rhonchi  ?ABDOMEN: Soft, non-tender, non-distended ?MUSCULOSKELETAL:  No edema; No deformity  ?SKIN: Warm and dry ?NEUROLOGIC:  Alert and oriented x 3 ? ? ? ? ?ASSESSMENT:   ? ?1. Chronic diastolic CHF (congestive heart failure), NYHA class 1 ([place])   ?2. Carotid artery calcification, bilateral   ? ?PLAN:   ? ?  ? ?1.  Shortness of breath with exertion: Sandra Park originally presented with shortness of breath with exertion.  She did well on a treadmill test.  She was found to have a hypertensive response to exercise.  Echocardiogram revealed hyperdynamic left ventricle.  We started her on carvedilol 6.25 mg twice a day.   ? ?She seems to be doing very well on carvedilol.  She is able to exercise.  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 ? ?We will plan on seeing

## 2021-08-23 NOTE — Patient Instructions (Signed)
Medication Instructions:  ?Your physician recommends that you continue on your current medications as directed. Please refer to the Current Medication list given to you today. ? ?*If you need a refill on your cardiac medications before your next appointment, please call your pharmacy* ? ? ?Lab Work: ?NONE ?If you have labs (blood work) drawn today and your tests are completely normal, you will receive your results only by: ?MyChart Message (if you have MyChart) OR ?A paper copy in the mail ?If you have any lab test that is abnormal or we need to change your treatment, we will call you to review the results. ? ? ?Testing/Procedures: ?NONE ? ? ?Follow-Up: ?At [facility], you and your health needs are our priority.  As part of our continuing mission to provide you with exceptional heart care, we have created designated Provider Care Teams.  These Care Teams include your primary Cardiologist (physician) and Advanced Practice Providers (APPs -  Physician Assistants and Nurse Practitioners) who all work together to provide you with the care you need, when you need it. ? ?Your next appointment:   ?1 year(s) ? ?The format for your next appointment:   ?In Person ? ?Provider:   ?Mertie Moores, MD  or Robbie Lis, PA-C, Christen Bame, NP, or Richardson Dopp, PA-C      ? ? ?Important Information About Sugar ? ? ? ? ?  ?

## 2021-09-07 ENCOUNTER — Encounter: Payer: Self-pay | Admitting: Cardiovascular Disease

## 2021-09-07 ENCOUNTER — Other Ambulatory Visit (HOSPITAL_COMMUNITY): Payer: Self-pay

## 2021-09-07 ENCOUNTER — Other Ambulatory Visit: Payer: Self-pay | Admitting: Cardiovascular Disease

## 2021-09-07 MED ORDER — CARVEDILOL 6.25 MG PO TABS
6.2500 mg | ORAL_TABLET | Freq: Two times a day (BID) | ORAL | 1 refills | Status: DC
  Filled 2021-09-07: qty 180, 90d supply, fill #0

## 2021-09-10 NOTE — Telephone Encounter (Signed)
Called and spoke directly to patient who states that this hasn't happened in a while, but it happened 2 days last week. Pt states that she can tell each time she's feeling her heart skip a beat and they are lasting up to an hour or two. Pt states that she sometimes has breathing alterations and "feels like I need to take a deep breath, but don't really get by breath." Asked pt to send [place] the kardia mobile strips she has so that we can investigate. The episodes seem to be brought about at random, so Zio even at 14 days may not catch this happening. Pt also says that she used to take Toprol Xl and didn't notice this happening as it is now, but she is no longer prescribed this. Will await kardia images that captured pvc's, then forward to MD for input. ?

## 2021-09-18 ENCOUNTER — Encounter: Payer: Self-pay | Admitting: Cardiovascular Disease

## 2021-09-19 NOTE — Telephone Encounter (Signed)
I am having a hard time pulling up the media attachments on computer 1/   Can she send in hard copy of strips 2   Can try increasing carvedilol to 1.5 tabs (9.375 mg ) twice daily

## 2021-09-19 NOTE — Telephone Encounter (Signed)
Phone note from 09/10/21:  Called and spoke directly to patient who states that this hasn't happened in a while, but it happened 2 days last week. Pt states that she can tell each time she's feeling her heart skip a beat and they are lasting up to an hour or two. Pt states that she sometimes has breathing alterations and "feels like I need to take a deep breath, but don't really get by breath." Asked pt to send [place] the kardia mobile strips she has so that we can investigate. The episodes seem to be brought about at random, so Zio even at 14 days may not catch this happening. Pt also says that she used to take Toprol Xl and didn't notice this happening as it is now, but she is no longer prescribed this. Will await kardia images that captured pvc's, then forward to MD for input.  Pt now sending in [place] mobile readings showing frequent PVC's. Will route to covering MD for review and advisement.

## 2021-10-05 ENCOUNTER — Other Ambulatory Visit: Payer: Self-pay | Admitting: Cardiovascular Disease

## 2021-10-05 ENCOUNTER — Other Ambulatory Visit (HOSPITAL_COMMUNITY): Payer: Self-pay

## 2021-10-05 MED ORDER — CARVEDILOL 6.25 MG PO TABS
ORAL_TABLET | ORAL | 3 refills | Status: DC
  Filled 2021-10-05: qty 270, fill #0
  Filled 2021-11-12: qty 270, 90d supply, fill #0
  Filled 2022-02-10: qty 270, 90d supply, fill #1
  Filled 2022-05-12: qty 270, 90d supply, fill #2
  Filled 2022-08-11: qty 270, 90d supply, fill #3

## 2021-10-05 NOTE — Progress Notes (Signed)
Increase to 1.5 tablets BID per Harrington Challenger and [employer].

## 2021-10-08 ENCOUNTER — Other Ambulatory Visit (HOSPITAL_COMMUNITY): Payer: Self-pay

## 2021-10-09 ENCOUNTER — Other Ambulatory Visit: Payer: Self-pay | Admitting: Family Medicine

## 2021-10-09 DIAGNOSIS — Z1231 Encounter for screening mammogram for malignant neoplasm of breast: Secondary | ICD-10-CM

## 2021-11-12 ENCOUNTER — Other Ambulatory Visit (HOSPITAL_COMMUNITY): Payer: Self-pay

## 2021-11-14 ENCOUNTER — Other Ambulatory Visit (HOSPITAL_COMMUNITY): Payer: Self-pay

## 2021-11-14 DIAGNOSIS — M170 Bilateral primary osteoarthritis of knee: Secondary | ICD-10-CM | POA: Diagnosis not present

## 2021-11-14 MED ORDER — DICLOFENAC SODIUM 1 % EX GEL
CUTANEOUS | 1 refills | Status: DC
  Filled 2021-11-14: qty 300, 30d supply, fill #0
  Filled 2021-12-24: qty 300, 30d supply, fill #1
  Filled 2022-06-20: qty 300, 30d supply, fill #2

## 2021-11-15 ENCOUNTER — Other Ambulatory Visit (HOSPITAL_COMMUNITY): Payer: Self-pay

## 2021-11-15 DIAGNOSIS — M5030 Other cervical disc degeneration, unspecified cervical region: Secondary | ICD-10-CM | POA: Diagnosis not present

## 2021-11-15 MED ORDER — PREDNISONE 10 MG PO TABS
ORAL_TABLET | ORAL | 0 refills | Status: DC
  Filled 2021-11-15: qty 21, 6d supply, fill #0

## 2021-11-19 ENCOUNTER — Ambulatory Visit: Payer: PPO

## 2021-11-21 ENCOUNTER — Ambulatory Visit
Admission: RE | Admit: 2021-11-21 | Discharge: 2021-11-21 | Disposition: A | Discharge status: Home / Self Care | Payer: PPO | Source: Ambulatory Visit | Attending: Family Medicine | Admitting: Family Medicine

## 2021-11-21 DIAGNOSIS — Z1231 Encounter for screening mammogram for malignant neoplasm of breast: Secondary | ICD-10-CM | POA: Diagnosis not present

## 2021-11-21 DIAGNOSIS — M170 Bilateral primary osteoarthritis of knee: Secondary | ICD-10-CM | POA: Diagnosis not present

## 2021-11-28 DIAGNOSIS — M170 Bilateral primary osteoarthritis of knee: Secondary | ICD-10-CM | POA: Diagnosis not present

## 2021-12-04 DIAGNOSIS — M542 Cervicalgia: Secondary | ICD-10-CM | POA: Diagnosis not present

## 2021-12-11 DIAGNOSIS — M542 Cervicalgia: Secondary | ICD-10-CM | POA: Diagnosis not present

## 2021-12-18 DIAGNOSIS — M542 Cervicalgia: Secondary | ICD-10-CM | POA: Diagnosis not present

## 2021-12-24 ENCOUNTER — Other Ambulatory Visit (HOSPITAL_COMMUNITY): Payer: Self-pay

## 2021-12-25 ENCOUNTER — Other Ambulatory Visit (HOSPITAL_COMMUNITY): Payer: Self-pay

## 2021-12-25 DIAGNOSIS — M542 Cervicalgia: Secondary | ICD-10-CM | POA: Diagnosis not present

## 2022-01-01 DIAGNOSIS — H2513 Age-related nuclear cataract, bilateral: Secondary | ICD-10-CM | POA: Diagnosis not present

## 2022-01-01 DIAGNOSIS — M542 Cervicalgia: Secondary | ICD-10-CM | POA: Diagnosis not present

## 2022-01-01 DIAGNOSIS — H52203 Unspecified astigmatism, bilateral: Secondary | ICD-10-CM | POA: Diagnosis not present

## 2022-01-03 DIAGNOSIS — E7800 Pure hypercholesterolemia, unspecified: Secondary | ICD-10-CM | POA: Diagnosis not present

## 2022-01-03 DIAGNOSIS — M810 Age-related osteoporosis without current pathological fracture: Secondary | ICD-10-CM | POA: Diagnosis not present

## 2022-01-03 DIAGNOSIS — K219 Gastro-esophageal reflux disease without esophagitis: Secondary | ICD-10-CM | POA: Diagnosis not present

## 2022-01-03 DIAGNOSIS — M4722 Other spondylosis with radiculopathy, cervical region: Secondary | ICD-10-CM | POA: Diagnosis not present

## 2022-01-08 DIAGNOSIS — Z23 Encounter for immunization: Secondary | ICD-10-CM | POA: Diagnosis not present

## 2022-01-08 DIAGNOSIS — M4722 Other spondylosis with radiculopathy, cervical region: Secondary | ICD-10-CM | POA: Diagnosis not present

## 2022-01-08 DIAGNOSIS — M8580 Other specified disorders of bone density and structure, unspecified site: Secondary | ICD-10-CM | POA: Diagnosis not present

## 2022-01-08 DIAGNOSIS — R0981 Nasal congestion: Secondary | ICD-10-CM | POA: Diagnosis not present

## 2022-01-08 DIAGNOSIS — I7090 Unspecified atherosclerosis: Secondary | ICD-10-CM | POA: Diagnosis not present

## 2022-01-08 DIAGNOSIS — Z79899 Other long term (current) drug therapy: Secondary | ICD-10-CM | POA: Diagnosis not present

## 2022-01-08 DIAGNOSIS — R400 Somnolence: Secondary | ICD-10-CM | POA: Diagnosis not present

## 2022-01-08 DIAGNOSIS — Z681 Body mass index (BMI) 19 or less, adult: Secondary | ICD-10-CM | POA: Diagnosis not present

## 2022-01-08 DIAGNOSIS — M810 Age-related osteoporosis without current pathological fracture: Secondary | ICD-10-CM | POA: Diagnosis not present

## 2022-01-08 DIAGNOSIS — E7800 Pure hypercholesterolemia, unspecified: Secondary | ICD-10-CM | POA: Diagnosis not present

## 2022-01-08 DIAGNOSIS — R202 Paresthesia of skin: Secondary | ICD-10-CM | POA: Diagnosis not present

## 2022-01-08 DIAGNOSIS — M47816 Spondylosis without myelopathy or radiculopathy, lumbar region: Secondary | ICD-10-CM | POA: Diagnosis not present

## 2022-01-08 DIAGNOSIS — I493 Ventricular premature depolarization: Secondary | ICD-10-CM | POA: Diagnosis not present

## 2022-01-11 ENCOUNTER — Other Ambulatory Visit: Payer: Self-pay | Admitting: Family Medicine

## 2022-01-11 DIAGNOSIS — M8580 Other specified disorders of bone density and structure, unspecified site: Secondary | ICD-10-CM

## 2022-01-15 ENCOUNTER — Other Ambulatory Visit (HOSPITAL_COMMUNITY): Payer: Self-pay

## 2022-01-16 ENCOUNTER — Other Ambulatory Visit (HOSPITAL_COMMUNITY): Payer: Self-pay

## 2022-01-16 MED ORDER — ROSUVASTATIN CALCIUM 5 MG PO TABS
5.0000 mg | ORAL_TABLET | Freq: Every day | ORAL | 1 refills | Status: DC
  Filled 2022-01-16: qty 90, 90d supply, fill #0
  Filled 2022-04-18: qty 90, 90d supply, fill #1

## 2022-01-16 MED ORDER — FLUTICASONE PROPIONATE 50 MCG/ACT NA SUSP
1.0000 | Freq: Every day | NASAL | 3 refills | Status: DC
  Filled 2022-01-16: qty 16, 60d supply, fill #0
  Filled 2022-04-18: qty 16, 60d supply, fill #1
  Filled 2022-07-15: qty 16, 60d supply, fill #2
  Filled 2022-10-01: qty 16, 60d supply, fill #3

## 2022-01-29 ENCOUNTER — Other Ambulatory Visit (HOSPITAL_COMMUNITY): Payer: Self-pay

## 2022-01-29 DIAGNOSIS — Z1389 Encounter for screening for other disorder: Secondary | ICD-10-CM | POA: Diagnosis not present

## 2022-01-29 DIAGNOSIS — Z0000 Encounter for general adult medical examination without abnormal findings: Secondary | ICD-10-CM | POA: Diagnosis not present

## 2022-01-29 MED ORDER — IBANDRONATE SODIUM 150 MG PO TABS
ORAL_TABLET | ORAL | 3 refills | Status: DC
  Filled 2022-01-29: qty 3, 90d supply, fill #0

## 2022-01-30 ENCOUNTER — Other Ambulatory Visit (HOSPITAL_COMMUNITY): Payer: Self-pay

## 2022-02-06 DIAGNOSIS — G4719 Other hypersomnia: Secondary | ICD-10-CM | POA: Diagnosis not present

## 2022-02-11 ENCOUNTER — Other Ambulatory Visit (HOSPITAL_COMMUNITY): Payer: Self-pay

## 2022-02-26 DIAGNOSIS — G4733 Obstructive sleep apnea (adult) (pediatric): Secondary | ICD-10-CM | POA: Diagnosis not present

## 2022-02-27 DIAGNOSIS — G4733 Obstructive sleep apnea (adult) (pediatric): Secondary | ICD-10-CM | POA: Diagnosis not present

## 2022-02-27 DIAGNOSIS — G5602 Carpal tunnel syndrome, left upper limb: Secondary | ICD-10-CM | POA: Diagnosis not present

## 2022-03-18 ENCOUNTER — Other Ambulatory Visit (HOSPITAL_COMMUNITY): Payer: Self-pay

## 2022-03-20 DIAGNOSIS — M13832 Other specified arthritis, left wrist: Secondary | ICD-10-CM | POA: Diagnosis not present

## 2022-03-20 DIAGNOSIS — M79641 Pain in right hand: Secondary | ICD-10-CM | POA: Diagnosis not present

## 2022-03-20 DIAGNOSIS — M13842 Other specified arthritis, left hand: Secondary | ICD-10-CM | POA: Diagnosis not present

## 2022-03-20 DIAGNOSIS — G5602 Carpal tunnel syndrome, left upper limb: Secondary | ICD-10-CM | POA: Diagnosis not present

## 2022-03-20 DIAGNOSIS — M79642 Pain in left hand: Secondary | ICD-10-CM | POA: Diagnosis not present

## 2022-03-20 DIAGNOSIS — G5601 Carpal tunnel syndrome, right upper limb: Secondary | ICD-10-CM | POA: Diagnosis not present

## 2022-03-20 DIAGNOSIS — M13831 Other specified arthritis, right wrist: Secondary | ICD-10-CM | POA: Diagnosis not present

## 2022-03-26 DIAGNOSIS — R400 Somnolence: Secondary | ICD-10-CM | POA: Diagnosis not present

## 2022-03-26 DIAGNOSIS — G4733 Obstructive sleep apnea (adult) (pediatric): Secondary | ICD-10-CM | POA: Diagnosis not present

## 2022-04-25 DIAGNOSIS — G4733 Obstructive sleep apnea (adult) (pediatric): Secondary | ICD-10-CM | POA: Diagnosis not present

## 2022-04-25 DIAGNOSIS — R400 Somnolence: Secondary | ICD-10-CM | POA: Diagnosis not present

## 2022-05-08 ENCOUNTER — Other Ambulatory Visit (HOSPITAL_COMMUNITY): Payer: Self-pay

## 2022-05-08 MED ORDER — ONDANSETRON HCL 4 MG PO TABS
ORAL_TABLET | ORAL | 0 refills | Status: DC
  Filled 2022-05-08: qty 15, 3d supply, fill #0

## 2022-05-08 MED ORDER — METHOCARBAMOL 500 MG PO TABS
ORAL_TABLET | ORAL | 0 refills | Status: DC
  Filled 2022-05-08: qty 40, 10d supply, fill #0

## 2022-05-08 MED ORDER — DOXYCYCLINE HYCLATE 100 MG PO CAPS
100.0000 mg | ORAL_CAPSULE | Freq: Two times a day (BID) | ORAL | 0 refills | Status: AC
  Filled 2022-05-08: qty 28, 14d supply, fill #0

## 2022-05-08 MED ORDER — OXYCODONE HCL 5 MG PO TABS
ORAL_TABLET | ORAL | 0 refills | Status: DC
  Filled 2022-05-08: qty 42, 7d supply, fill #0

## 2022-05-09 ENCOUNTER — Other Ambulatory Visit (HOSPITAL_COMMUNITY): Payer: Self-pay

## 2022-05-13 ENCOUNTER — Other Ambulatory Visit (HOSPITAL_COMMUNITY): Payer: Self-pay

## 2022-05-26 DIAGNOSIS — G4733 Obstructive sleep apnea (adult) (pediatric): Secondary | ICD-10-CM | POA: Diagnosis not present

## 2022-05-26 DIAGNOSIS — R400 Somnolence: Secondary | ICD-10-CM | POA: Diagnosis not present

## 2022-06-10 ENCOUNTER — Other Ambulatory Visit (HOSPITAL_BASED_OUTPATIENT_CLINIC_OR_DEPARTMENT_OTHER): Payer: Self-pay

## 2022-06-10 DIAGNOSIS — D2372 Other benign neoplasm of skin of left lower limb, including hip: Secondary | ICD-10-CM | POA: Diagnosis not present

## 2022-06-10 DIAGNOSIS — L821 Other seborrheic keratosis: Secondary | ICD-10-CM | POA: Diagnosis not present

## 2022-06-10 MED ORDER — COMIRNATY 30 MCG/0.3ML IM SUSY
PREFILLED_SYRINGE | INTRAMUSCULAR | 0 refills | Status: AC
  Filled 2022-06-10: qty 0.3, 1d supply, fill #0

## 2022-06-13 DIAGNOSIS — G5601 Carpal tunnel syndrome, right upper limb: Secondary | ICD-10-CM | POA: Diagnosis not present

## 2022-06-13 DIAGNOSIS — G5602 Carpal tunnel syndrome, left upper limb: Secondary | ICD-10-CM | POA: Diagnosis not present

## 2022-06-20 ENCOUNTER — Other Ambulatory Visit (HOSPITAL_COMMUNITY): Payer: Self-pay

## 2022-06-24 ENCOUNTER — Ambulatory Visit
Admission: RE | Admit: 2022-06-24 | Discharge: 2022-06-24 | Disposition: A | Discharge status: Home / Self Care | Payer: Medicare Other | Source: Ambulatory Visit | Attending: Family Medicine | Admitting: Family Medicine

## 2022-06-24 DIAGNOSIS — M8580 Other specified disorders of bone density and structure, unspecified site: Secondary | ICD-10-CM

## 2022-06-24 DIAGNOSIS — M85851 Other specified disorders of bone density and structure, right thigh: Secondary | ICD-10-CM | POA: Diagnosis not present

## 2022-06-24 DIAGNOSIS — M810 Age-related osteoporosis without current pathological fracture: Secondary | ICD-10-CM | POA: Diagnosis not present

## 2022-06-24 DIAGNOSIS — Z780 Asymptomatic menopausal state: Secondary | ICD-10-CM | POA: Diagnosis not present

## 2022-06-26 DIAGNOSIS — R400 Somnolence: Secondary | ICD-10-CM | POA: Diagnosis not present

## 2022-06-26 DIAGNOSIS — G4733 Obstructive sleep apnea (adult) (pediatric): Secondary | ICD-10-CM | POA: Diagnosis not present

## 2022-06-28 DIAGNOSIS — M79642 Pain in left hand: Secondary | ICD-10-CM | POA: Diagnosis not present

## 2022-07-02 DIAGNOSIS — R0781 Pleurodynia: Secondary | ICD-10-CM | POA: Diagnosis not present

## 2022-07-04 DIAGNOSIS — G4733 Obstructive sleep apnea (adult) (pediatric): Secondary | ICD-10-CM | POA: Diagnosis not present

## 2022-07-09 DIAGNOSIS — E7800 Pure hypercholesterolemia, unspecified: Secondary | ICD-10-CM | POA: Diagnosis not present

## 2022-07-09 DIAGNOSIS — M810 Age-related osteoporosis without current pathological fracture: Secondary | ICD-10-CM | POA: Diagnosis not present

## 2022-07-09 DIAGNOSIS — I5032 Chronic diastolic (congestive) heart failure: Secondary | ICD-10-CM | POA: Diagnosis not present

## 2022-07-09 DIAGNOSIS — R748 Abnormal levels of other serum enzymes: Secondary | ICD-10-CM | POA: Diagnosis not present

## 2022-07-09 DIAGNOSIS — E538 Deficiency of other specified B group vitamins: Secondary | ICD-10-CM | POA: Diagnosis not present

## 2022-07-09 DIAGNOSIS — Z79899 Other long term (current) drug therapy: Secondary | ICD-10-CM | POA: Diagnosis not present

## 2022-07-11 DIAGNOSIS — M25642 Stiffness of left hand, not elsewhere classified: Secondary | ICD-10-CM | POA: Diagnosis not present

## 2022-07-15 ENCOUNTER — Other Ambulatory Visit (HOSPITAL_COMMUNITY): Payer: Self-pay

## 2022-07-16 ENCOUNTER — Other Ambulatory Visit (HOSPITAL_COMMUNITY): Payer: Self-pay

## 2022-07-16 ENCOUNTER — Other Ambulatory Visit: Payer: Self-pay

## 2022-07-16 MED ORDER — ROSUVASTATIN CALCIUM 5 MG PO TABS
5.0000 mg | ORAL_TABLET | Freq: Every day | ORAL | 1 refills | Status: AC
  Filled 2022-07-16: qty 90, 90d supply, fill #0
  Filled 2022-10-30: qty 90, 90d supply, fill #1

## 2022-07-17 ENCOUNTER — Other Ambulatory Visit (HOSPITAL_COMMUNITY): Payer: Self-pay

## 2022-07-22 ENCOUNTER — Other Ambulatory Visit (HOSPITAL_COMMUNITY): Payer: Self-pay

## 2022-07-25 ENCOUNTER — Encounter (HOSPITAL_COMMUNITY): Payer: Medicare Other

## 2022-07-25 ENCOUNTER — Other Ambulatory Visit (HOSPITAL_COMMUNITY): Payer: Self-pay | Admitting: *Deleted

## 2022-07-29 ENCOUNTER — Encounter (HOSPITAL_COMMUNITY)
Admission: RE | Admit: 2022-07-29 | Discharge: 2022-07-29 | Disposition: A | Discharge status: Home / Self Care | Payer: Medicare Other | Source: Ambulatory Visit | Attending: Family Medicine | Admitting: Family Medicine

## 2022-07-29 DIAGNOSIS — M810 Age-related osteoporosis without current pathological fracture: Secondary | ICD-10-CM | POA: Insufficient documentation

## 2022-07-29 MED ORDER — ZOLEDRONIC ACID 5 MG/100ML IV SOLN
INTRAVENOUS | Status: AC
  Filled 2022-07-29: qty 100

## 2022-07-29 MED ORDER — ZOLEDRONIC ACID 5 MG/100ML IV SOLN
5.0000 mg | Freq: Once | INTRAVENOUS | Status: AC
  Administered 2022-07-29: 5 mg via INTRAVENOUS

## 2022-08-01 DIAGNOSIS — R400 Somnolence: Secondary | ICD-10-CM | POA: Diagnosis not present

## 2022-08-01 DIAGNOSIS — G4733 Obstructive sleep apnea (adult) (pediatric): Secondary | ICD-10-CM | POA: Diagnosis not present

## 2022-08-15 ENCOUNTER — Other Ambulatory Visit (HOSPITAL_COMMUNITY): Payer: Self-pay

## 2022-08-19 ENCOUNTER — Encounter: Payer: Self-pay | Admitting: Physician Assistant

## 2022-08-19 ENCOUNTER — Ambulatory Visit: Payer: Medicare Other | Attending: Physician Assistant | Admitting: Physician Assistant

## 2022-08-19 VITALS — BP 110/57 | HR 64 | Ht 65.0 in | Wt 119.8 lb

## 2022-08-19 DIAGNOSIS — R002 Palpitations: Secondary | ICD-10-CM

## 2022-08-19 DIAGNOSIS — I779 Disorder of arteries and arterioles, unspecified: Secondary | ICD-10-CM

## 2022-08-19 DIAGNOSIS — I739 Peripheral vascular disease, unspecified: Secondary | ICD-10-CM

## 2022-08-19 DIAGNOSIS — E7800 Pure hypercholesterolemia, unspecified: Secondary | ICD-10-CM | POA: Diagnosis not present

## 2022-08-19 DIAGNOSIS — M79605 Pain in left leg: Secondary | ICD-10-CM | POA: Insufficient documentation

## 2022-08-19 HISTORY — DX: Disorder of arteries and arterioles, unspecified: I77.9

## 2022-08-19 HISTORY — DX: Pain in left leg: M79.605

## 2022-08-19 NOTE — Patient Instructions (Signed)
Medication Instructions:   Your physician recommends that you continue on your current medications as directed. Please refer to the Current Medication list given to you today.   *If you need a refill on your cardiac medications before your next appointment, please call your pharmacy*   Lab Work:  None ordered.  If you have labs (blood work) drawn today and your tests are completely normal, you will receive your results only by: MyChart Message (if you have MyChart) OR A paper copy in the mail If you have any lab test that is abnormal or we need to change your treatment, we will call you to review the results.   Testing/Procedures:  Your physician has requested that you have a carotid duplex. This test is an ultrasound of the carotid arteries in your neck. It looks at blood flow through these arteries that supply the brain with blood. Allow one hour for this exam. There are no restrictions or special instructions.  Your physician has requested that you have a lower extremity arterial exercise duplex. During this test, exercise and ultrasound are used to evaluate arterial blood flow in the legs. Allow one hour for this exam. There are no restrictions or special instructions.  Your physician has requested that you have an ankle brachial index (ABI). During this test an ultrasound and blood pressure cuff are used to evaluate the arteries that supply the arms and legs with blood. Allow thirty minutes for this exam. There are no restrictions or special instructions.   Follow-Up: At [facility], you and your health needs are our priority.  As part of our continuing mission to provide you with exceptional heart care, we have created designated Provider Care Teams.  These Care Teams include your primary Cardiologist (physician) and Advanced Practice Providers (APPs -  Physician Assistants and Nurse Practitioners) who all work together to provide you with the care you need, when you need  it.  We recommend signing up for the patient portal called "MyChart".  Sign up information is provided on this After Visit Summary.  MyChart is used to connect with patients for Virtual Visits (Telemedicine).  Patients are able to view lab/test results, encounter notes, upcoming appointments, etc.  Non-urgent messages can be sent to your provider as well.   To learn more about what you can do with MyChart, go to [email or web address].    Your next appointment:   1 year(s)  Provider:   Kristeen Miss, MD     Other Instructions  Your physician wants you to follow-up in: 1 year with Dr. Melburn Popper.  You will receive a reminder letter in the mail two months in advance. If you don't receive a letter, please call our office to schedule the follow-up appointment.

## 2022-08-19 NOTE — Assessment & Plan Note (Signed)
She has difficult to palpate pulses in her feet. She notes exertional leg pain. It has been felt to be due to her back problems in the past. She is low risk for PVD. However, I recommend obtaining ABIs to rule out the possibility of peripheral arterial disease.  Arrange ABIs/Arterial US

## 2022-08-19 NOTE — Assessment & Plan Note (Signed)
Overall well controlled on current Rx. Continue Carvedilol 9.375 mg twice daily.

## 2022-08-19 NOTE — Assessment & Plan Note (Signed)
She had a CT scan in the past with carotid artery calcifications. She has not had f/u Dopplers. Arrange Carotid US. Continue ASA 81 mg once daily, Crestor 5 mg once daily.

## 2022-08-19 NOTE — Progress Notes (Signed)
Cardiology Office Note:    Date:  08/19/2022  ID:  Sandra Park, Sandra Park Apr 25, 1951, MRN [id number] PCP: Sigmund Hazel, MD  Sandra Park Providers Cardiologist:  Kristeen Miss, MD       Patient Profile:   Shortness of breath  CAC 01/14/2018: CAC score 0 TTE 06/08/2019: EF 65-70, no RWMA, GR 1 DD, GLS -23.1, normal RVSF, normal PASP, trivial MR, trivial AI, RAP 3 ETT 06/09/2019: NSR, hypertensive response to exercise, 1 mm upsloping ST depression at peak exercise and recovery-not thought to be significant CCTA 12/31/2019: CAC score 0, no CAD, + PFO Palpitations Event monitor 09/2015: NSR, rare PVCs, no sustained arrhythmia or pathologic pauses Hyperlipidemia  Spinal stenosis  Carotid artery Ca2+ on CT in 2021  OSA on CPAP  Livedo Reticularis     History of Present Illness:   Sandra Park is a 72 y.o. female who returns for follow-up of shortness of breath and palpitations.  She was last seen by Dr. Elease Hashimoto 08/23/2021. She is here alone. She has been dx with OSA and is now on CPAP. She has noted L hip and lower leg pain with walking. She has spinal stenosis and her orthopedist had her do NCS. She did not have neuropathy. She has not had chest pain. She gets short of breath with more extreme activities. She exercises on a regular basis. She has not had palpitations, syncope.   ROS See HPI    Studies Reviewed:    EKG:  NSR, HR 64, normal axis, non-specific ST-TW changes, QTc 422 ms  Risk Assessment/Calculations:             Physical Exam:   VS:  BP (!) 110/57   Pulse 64   Ht  (1.651 m)   Wt 119 lb 12.8 oz (54.3 kg)   SpO2 99%   BMI 19.94 kg/m    Wt Readings from Last 3 Encounters:  08/19/22 119 lb 12.8 oz (54.3 kg)  07/29/22 120 lb (54.4 kg)  08/23/21 113 lb (51.3 kg)    Constitutional:      Appearance: Healthy appearance. Not in distress.  Neck:     Vascular: No carotid bruit. JVD normal.  Pulmonary:     Breath sounds: Normal breath sounds. No wheezing. No  rales.  Cardiovascular:     Normal rate. Regular rhythm. Normal S1. Normal S2.      Murmurs: There is no murmur.  Pulses:    Dorsalis pedis: 1+ bilaterally.    Posterior tibial: 1+ bilaterally. Edema:    Peripheral edema absent.  Abdominal:     Palpations: Abdomen is soft.        ASSESSMENT AND PLAN:   Palpitations Overall well controlled on current Rx. Continue Carvedilol 9.375 mg twice daily.  Bilateral carotid artery disease She had a CT scan in the past with carotid artery calcifications. She has not had f/u Dopplers. Arrange Carotid US. Continue ASA 81 mg once daily, Crestor 5 mg once daily.  Left leg claudication She has difficult to palpate pulses in her feet. She notes exertional leg pain. It has been felt to be due to her back problems in the past. She is low risk for PVD. However, I recommend obtaining ABIs to rule out the possibility of peripheral arterial disease.  Arrange ABIs/Arterial US  Pure hypercholesterolemia Labs reviewed from [facility]. LDL in 12/2021 was optimal at 66. Continue Crestor 5 mg once daily.       Dispo:  Return in about  1 year (around 08/19/2023) for Routine follow up in 1 year with Dr. Melburn Popper.  Signed, Tereso Newcomer, PA-C

## 2022-08-19 NOTE — Assessment & Plan Note (Signed)
Labs reviewed from [facility]. LDL in 12/2021 was optimal at 66. Continue Crestor 5 mg once daily.

## 2022-08-26 ENCOUNTER — Ambulatory Visit (HOSPITAL_COMMUNITY): Admission: RE | Admit: 2022-08-26 | Payer: Medicare Other | Source: Ambulatory Visit

## 2022-08-29 DIAGNOSIS — K080 Exfoliation of teeth due to systemic causes: Secondary | ICD-10-CM | POA: Diagnosis not present

## 2022-08-30 ENCOUNTER — Ambulatory Visit (HOSPITAL_BASED_OUTPATIENT_CLINIC_OR_DEPARTMENT_OTHER)
Admission: RE | Admit: 2022-08-30 | Discharge: 2022-08-30 | Disposition: A | Discharge status: Home / Self Care | Payer: Medicare Other | Source: Ambulatory Visit | Attending: Cardiovascular Disease | Admitting: Cardiovascular Disease

## 2022-08-30 ENCOUNTER — Ambulatory Visit (HOSPITAL_COMMUNITY)
Admission: RE | Admit: 2022-08-30 | Discharge: 2022-08-30 | Disposition: A | Discharge status: Home / Self Care | Payer: Medicare Other | Source: Ambulatory Visit | Attending: Cardiovascular Disease | Admitting: Cardiovascular Disease

## 2022-08-30 DIAGNOSIS — I779 Disorder of arteries and arterioles, unspecified: Secondary | ICD-10-CM | POA: Insufficient documentation

## 2022-08-30 DIAGNOSIS — R002 Palpitations: Secondary | ICD-10-CM

## 2022-08-30 DIAGNOSIS — I739 Peripheral vascular disease, unspecified: Secondary | ICD-10-CM | POA: Diagnosis not present

## 2022-08-31 LAB — VAS US ABI WITH/WO TBI
Left ABI: 1.11
Right ABI: 1.11

## 2022-09-01 ENCOUNTER — Encounter: Payer: Self-pay | Admitting: Physician Assistant

## 2022-09-02 NOTE — Progress Notes (Signed)
Pt has been made aware of normal result and verbalized understanding.  jw

## 2022-09-04 NOTE — Progress Notes (Signed)
Pt has been made aware of normal result and verbalized understanding.  jw

## 2022-09-09 DIAGNOSIS — K080 Exfoliation of teeth due to systemic causes: Secondary | ICD-10-CM | POA: Diagnosis not present

## 2022-09-16 ENCOUNTER — Other Ambulatory Visit (HOSPITAL_COMMUNITY): Payer: Self-pay

## 2022-09-16 MED ORDER — DICLOFENAC SODIUM 1 % EX GEL
2.0000 g | Freq: Four times a day (QID) | CUTANEOUS | 1 refills | Status: AC
  Filled 2022-09-16: qty 700, 87d supply, fill #0
  Filled 2023-04-10: qty 200, 25d supply, fill #1
  Filled 2023-07-10: qty 700, 87d supply, fill #1

## 2022-09-19 ENCOUNTER — Other Ambulatory Visit (HOSPITAL_COMMUNITY): Payer: Self-pay

## 2022-09-21 DIAGNOSIS — M48062 Spinal stenosis, lumbar region with neurogenic claudication: Secondary | ICD-10-CM | POA: Diagnosis not present

## 2022-09-21 DIAGNOSIS — M5451 Vertebrogenic low back pain: Secondary | ICD-10-CM | POA: Diagnosis not present

## 2022-09-21 DIAGNOSIS — M47816 Spondylosis without myelopathy or radiculopathy, lumbar region: Secondary | ICD-10-CM | POA: Diagnosis not present

## 2022-09-26 DIAGNOSIS — D225 Melanocytic nevi of trunk: Secondary | ICD-10-CM | POA: Diagnosis not present

## 2022-09-26 DIAGNOSIS — D692 Other nonthrombocytopenic purpura: Secondary | ICD-10-CM | POA: Diagnosis not present

## 2022-09-26 DIAGNOSIS — L821 Other seborrheic keratosis: Secondary | ICD-10-CM | POA: Diagnosis not present

## 2022-09-26 DIAGNOSIS — L570 Actinic keratosis: Secondary | ICD-10-CM | POA: Diagnosis not present

## 2022-09-26 DIAGNOSIS — L814 Other melanin hyperpigmentation: Secondary | ICD-10-CM | POA: Diagnosis not present

## 2022-10-08 ENCOUNTER — Other Ambulatory Visit (HOSPITAL_COMMUNITY): Payer: Self-pay

## 2022-10-08 MED ORDER — GABAPENTIN 100 MG PO CAPS
100.0000 mg | ORAL_CAPSULE | Freq: Every evening | ORAL | 3 refills | Status: DC
  Filled 2022-12-23: qty 90, 30d supply, fill #0
  Filled 2023-03-24: qty 90, 30d supply, fill #1
  Filled 2023-04-27: qty 90, 30d supply, fill #2

## 2022-10-10 ENCOUNTER — Other Ambulatory Visit: Payer: Self-pay | Admitting: Family Medicine

## 2022-10-10 DIAGNOSIS — M170 Bilateral primary osteoarthritis of knee: Secondary | ICD-10-CM | POA: Diagnosis not present

## 2022-10-10 DIAGNOSIS — Z1231 Encounter for screening mammogram for malignant neoplasm of breast: Secondary | ICD-10-CM

## 2022-10-17 DIAGNOSIS — M170 Bilateral primary osteoarthritis of knee: Secondary | ICD-10-CM | POA: Diagnosis not present

## 2022-10-24 DIAGNOSIS — M170 Bilateral primary osteoarthritis of knee: Secondary | ICD-10-CM | POA: Diagnosis not present

## 2022-11-11 ENCOUNTER — Other Ambulatory Visit: Payer: Self-pay | Admitting: Cardiovascular Disease

## 2022-11-11 ENCOUNTER — Other Ambulatory Visit: Payer: Self-pay

## 2022-11-12 ENCOUNTER — Other Ambulatory Visit (HOSPITAL_COMMUNITY): Payer: Self-pay

## 2022-11-12 MED ORDER — CARVEDILOL 6.25 MG PO TABS
ORAL_TABLET | ORAL | 3 refills | Status: DC
  Filled 2022-11-12: qty 45, 30d supply, fill #0
  Filled 2022-11-13: qty 270, 90d supply, fill #0
  Filled 2023-02-09: qty 270, 90d supply, fill #1
  Filled 2023-05-06: qty 270, 90d supply, fill #2
  Filled 2023-08-06: qty 270, 90d supply, fill #3

## 2022-11-13 ENCOUNTER — Other Ambulatory Visit (HOSPITAL_COMMUNITY): Payer: Self-pay

## 2022-11-26 ENCOUNTER — Ambulatory Visit
Admission: RE | Admit: 2022-11-26 | Discharge: 2022-11-26 | Disposition: A | Discharge status: Home / Self Care | Payer: Medicare Other | Source: Ambulatory Visit | Attending: Family Medicine | Admitting: Family Medicine

## 2022-11-26 DIAGNOSIS — Z1231 Encounter for screening mammogram for malignant neoplasm of breast: Secondary | ICD-10-CM | POA: Diagnosis not present

## 2022-12-02 ENCOUNTER — Other Ambulatory Visit (HOSPITAL_COMMUNITY): Payer: Self-pay

## 2022-12-03 ENCOUNTER — Other Ambulatory Visit (HOSPITAL_COMMUNITY): Payer: Self-pay

## 2022-12-03 DIAGNOSIS — J069 Acute upper respiratory infection, unspecified: Secondary | ICD-10-CM | POA: Diagnosis not present

## 2022-12-03 MED ORDER — FLUTICASONE PROPIONATE 50 MCG/ACT NA SUSP
1.0000 | Freq: Every day | NASAL | 0 refills | Status: AC
  Filled 2022-12-03: qty 16, 30d supply, fill #0
  Filled 2023-04-28: qty 16, 60d supply, fill #0

## 2022-12-03 MED ORDER — FLUTICASONE PROPIONATE 50 MCG/ACT NA SUSP
1.0000 | Freq: Every day | NASAL | 1 refills | Status: AC
  Filled 2022-12-03: qty 16, 60d supply, fill #0
  Filled 2023-02-26: qty 16, 60d supply, fill #1

## 2022-12-04 ENCOUNTER — Other Ambulatory Visit (HOSPITAL_COMMUNITY): Payer: Self-pay

## 2022-12-06 ENCOUNTER — Other Ambulatory Visit (HOSPITAL_COMMUNITY): Payer: Self-pay

## 2022-12-23 ENCOUNTER — Other Ambulatory Visit (HOSPITAL_COMMUNITY): Payer: Self-pay

## 2022-12-27 ENCOUNTER — Other Ambulatory Visit (HOSPITAL_COMMUNITY): Payer: Self-pay

## 2023-01-07 DIAGNOSIS — R7303 Prediabetes: Secondary | ICD-10-CM | POA: Diagnosis not present

## 2023-01-07 DIAGNOSIS — I5032 Chronic diastolic (congestive) heart failure: Secondary | ICD-10-CM | POA: Diagnosis not present

## 2023-01-07 DIAGNOSIS — I110 Hypertensive heart disease with heart failure: Secondary | ICD-10-CM | POA: Diagnosis not present

## 2023-01-07 DIAGNOSIS — R252 Cramp and spasm: Secondary | ICD-10-CM | POA: Diagnosis not present

## 2023-01-07 DIAGNOSIS — R0981 Nasal congestion: Secondary | ICD-10-CM | POA: Diagnosis not present

## 2023-01-07 DIAGNOSIS — E7800 Pure hypercholesterolemia, unspecified: Secondary | ICD-10-CM | POA: Diagnosis not present

## 2023-01-07 DIAGNOSIS — Z23 Encounter for immunization: Secondary | ICD-10-CM | POA: Diagnosis not present

## 2023-01-10 ENCOUNTER — Other Ambulatory Visit (HOSPITAL_COMMUNITY): Payer: Self-pay

## 2023-01-10 MED ORDER — ALBUTEROL SULFATE HFA 108 (90 BASE) MCG/ACT IN AERS
INHALATION_SPRAY | RESPIRATORY_TRACT | 1 refills | Status: AC
  Filled 2023-01-10: qty 18, 50d supply, fill #0
  Filled 2023-01-16: qty 6.7, 30d supply, fill #0
  Filled 2023-04-27: qty 6.7, 30d supply, fill #1

## 2023-01-16 ENCOUNTER — Other Ambulatory Visit (HOSPITAL_COMMUNITY): Payer: Self-pay

## 2023-01-16 DIAGNOSIS — G4733 Obstructive sleep apnea (adult) (pediatric): Secondary | ICD-10-CM | POA: Diagnosis not present

## 2023-01-21 ENCOUNTER — Other Ambulatory Visit (HOSPITAL_COMMUNITY): Payer: Self-pay

## 2023-01-21 MED ORDER — ROSUVASTATIN CALCIUM 10 MG PO TABS
10.0000 mg | ORAL_TABLET | Freq: Every day | ORAL | 3 refills | Status: AC
  Filled 2023-01-21: qty 90, 90d supply, fill #0
  Filled 2023-04-27: qty 90, 90d supply, fill #1
  Filled 2023-07-28: qty 90, 90d supply, fill #2
  Filled 2023-10-28: qty 90, 90d supply, fill #3

## 2023-01-22 DIAGNOSIS — G4733 Obstructive sleep apnea (adult) (pediatric): Secondary | ICD-10-CM | POA: Diagnosis not present

## 2023-01-22 DIAGNOSIS — R400 Somnolence: Secondary | ICD-10-CM | POA: Diagnosis not present

## 2023-01-29 ENCOUNTER — Other Ambulatory Visit (HOSPITAL_COMMUNITY): Payer: Self-pay

## 2023-02-04 DIAGNOSIS — Z0000 Encounter for general adult medical examination without abnormal findings: Secondary | ICD-10-CM | POA: Diagnosis not present

## 2023-02-04 DIAGNOSIS — Z1331 Encounter for screening for depression: Secondary | ICD-10-CM | POA: Diagnosis not present

## 2023-02-10 ENCOUNTER — Other Ambulatory Visit (HOSPITAL_COMMUNITY): Payer: Self-pay

## 2023-02-20 DIAGNOSIS — H52203 Unspecified astigmatism, bilateral: Secondary | ICD-10-CM | POA: Diagnosis not present

## 2023-03-04 ENCOUNTER — Other Ambulatory Visit (HOSPITAL_COMMUNITY): Payer: Self-pay

## 2023-03-06 DIAGNOSIS — K080 Exfoliation of teeth due to systemic causes: Secondary | ICD-10-CM | POA: Diagnosis not present

## 2023-03-20 DIAGNOSIS — K080 Exfoliation of teeth due to systemic causes: Secondary | ICD-10-CM | POA: Diagnosis not present

## 2023-03-21 ENCOUNTER — Other Ambulatory Visit (HOSPITAL_BASED_OUTPATIENT_CLINIC_OR_DEPARTMENT_OTHER): Payer: Self-pay

## 2023-03-21 MED ORDER — RSVPREF3 VAC RECOMB ADJUVANTED 120 MCG/0.5ML IM SUSR
0.5000 mL | Freq: Once | INTRAMUSCULAR | 0 refills | Status: AC
  Filled 2023-03-21: qty 0.5, 1d supply, fill #0

## 2023-03-25 ENCOUNTER — Other Ambulatory Visit: Payer: Self-pay

## 2023-03-31 ENCOUNTER — Other Ambulatory Visit (HOSPITAL_COMMUNITY): Payer: Self-pay

## 2023-04-12 ENCOUNTER — Other Ambulatory Visit: Payer: Self-pay

## 2023-04-12 ENCOUNTER — Other Ambulatory Visit (HOSPITAL_COMMUNITY): Payer: Self-pay

## 2023-04-14 ENCOUNTER — Other Ambulatory Visit (HOSPITAL_COMMUNITY): Payer: Self-pay

## 2023-04-15 ENCOUNTER — Other Ambulatory Visit (HOSPITAL_COMMUNITY): Payer: Self-pay

## 2023-04-15 ENCOUNTER — Other Ambulatory Visit: Payer: Self-pay

## 2023-04-22 DIAGNOSIS — E7800 Pure hypercholesterolemia, unspecified: Secondary | ICD-10-CM | POA: Diagnosis not present

## 2023-04-24 ENCOUNTER — Other Ambulatory Visit (HOSPITAL_COMMUNITY): Payer: Self-pay

## 2023-04-27 ENCOUNTER — Other Ambulatory Visit (HOSPITAL_COMMUNITY): Payer: Self-pay

## 2023-04-28 ENCOUNTER — Other Ambulatory Visit (HOSPITAL_COMMUNITY): Payer: Self-pay

## 2023-04-29 ENCOUNTER — Other Ambulatory Visit (HOSPITAL_COMMUNITY): Payer: Self-pay

## 2023-05-01 ENCOUNTER — Other Ambulatory Visit (HOSPITAL_COMMUNITY): Payer: Self-pay

## 2023-05-01 MED ORDER — FLUTICASONE PROPIONATE 50 MCG/ACT NA SUSP
1.0000 | Freq: Every day | NASAL | 0 refills | Status: AC
  Filled 2023-05-01 – 2024-01-13 (×2): qty 16, 60d supply, fill #0

## 2023-05-08 ENCOUNTER — Other Ambulatory Visit (HOSPITAL_COMMUNITY): Payer: Self-pay

## 2023-05-26 ENCOUNTER — Other Ambulatory Visit (HOSPITAL_COMMUNITY): Payer: Self-pay

## 2023-05-26 DIAGNOSIS — M5412 Radiculopathy, cervical region: Secondary | ICD-10-CM | POA: Diagnosis not present

## 2023-05-26 DIAGNOSIS — M5416 Radiculopathy, lumbar region: Secondary | ICD-10-CM | POA: Diagnosis not present

## 2023-05-26 MED ORDER — PREDNISONE 10 MG (21) PO TBPK
ORAL_TABLET | ORAL | 0 refills | Status: AC
  Filled 2023-05-26: qty 21, 6d supply, fill #0

## 2023-07-08 ENCOUNTER — Other Ambulatory Visit (HOSPITAL_COMMUNITY): Payer: Self-pay

## 2023-07-08 ENCOUNTER — Other Ambulatory Visit: Payer: Self-pay

## 2023-07-08 DIAGNOSIS — R0981 Nasal congestion: Secondary | ICD-10-CM | POA: Diagnosis not present

## 2023-07-08 DIAGNOSIS — K219 Gastro-esophageal reflux disease without esophagitis: Secondary | ICD-10-CM | POA: Diagnosis not present

## 2023-07-08 DIAGNOSIS — E7800 Pure hypercholesterolemia, unspecified: Secondary | ICD-10-CM | POA: Diagnosis not present

## 2023-07-08 DIAGNOSIS — M810 Age-related osteoporosis without current pathological fracture: Secondary | ICD-10-CM | POA: Diagnosis not present

## 2023-07-08 MED ORDER — MONTELUKAST SODIUM 10 MG PO TABS
10.0000 mg | ORAL_TABLET | Freq: Every day | ORAL | 0 refills | Status: DC
  Filled 2023-07-08: qty 30, 30d supply, fill #0

## 2023-07-08 MED ORDER — FAMOTIDINE 40 MG PO TABS
40.0000 mg | ORAL_TABLET | Freq: Two times a day (BID) | ORAL | 0 refills | Status: DC
  Filled 2023-07-08: qty 180, 90d supply, fill #0

## 2023-07-08 MED ORDER — FLUTICASONE PROPIONATE 50 MCG/ACT NA SUSP
1.0000 | Freq: Two times a day (BID) | NASAL | 3 refills | Status: DC
  Filled 2023-07-08: qty 16, 30d supply, fill #0
  Filled 2023-08-17: qty 16, 30d supply, fill #1
  Filled 2023-10-13: qty 16, 30d supply, fill #2
  Filled 2023-11-30: qty 16, 30d supply, fill #3

## 2023-07-10 ENCOUNTER — Other Ambulatory Visit: Payer: Self-pay

## 2023-07-10 ENCOUNTER — Other Ambulatory Visit (HOSPITAL_COMMUNITY): Payer: Self-pay

## 2023-07-14 ENCOUNTER — Other Ambulatory Visit (HOSPITAL_COMMUNITY): Payer: Self-pay

## 2023-07-15 DIAGNOSIS — G4733 Obstructive sleep apnea (adult) (pediatric): Secondary | ICD-10-CM | POA: Diagnosis not present

## 2023-07-15 DIAGNOSIS — R400 Somnolence: Secondary | ICD-10-CM | POA: Diagnosis not present

## 2023-07-22 DIAGNOSIS — G4733 Obstructive sleep apnea (adult) (pediatric): Secondary | ICD-10-CM | POA: Diagnosis not present

## 2023-07-22 DIAGNOSIS — R400 Somnolence: Secondary | ICD-10-CM | POA: Diagnosis not present

## 2023-08-01 ENCOUNTER — Other Ambulatory Visit (HOSPITAL_COMMUNITY): Payer: Self-pay

## 2023-08-04 ENCOUNTER — Other Ambulatory Visit (HOSPITAL_COMMUNITY): Payer: Self-pay

## 2023-08-04 MED ORDER — MONTELUKAST SODIUM 10 MG PO TABS
10.0000 mg | ORAL_TABLET | Freq: Every day | ORAL | 1 refills | Status: DC
  Filled 2023-08-04: qty 90, 90d supply, fill #0
  Filled 2023-10-28: qty 90, 90d supply, fill #1

## 2023-08-07 ENCOUNTER — Other Ambulatory Visit (HOSPITAL_COMMUNITY): Payer: Self-pay

## 2023-08-19 ENCOUNTER — Encounter: Payer: Self-pay | Admitting: Cardiovascular Disease

## 2023-08-19 NOTE — Progress Notes (Unsigned)
 Cardiology Office Note:    Date:  08/20/2023   ID:  Sandra Park, Sandra Park, Sandra Park  PCP:  Sandra Bradley, MD  Cardiologist:  Sandra Park  Electrophysiologist:  None   Referring MD: Sandra Bradley, MD   Chief Complaint  Patient presents with   Congestive Heart Failure            Sandra Park Apple is a 73 y.o. female with a hx of palpitations in the past.  We were asked to see her today by Sandra Park for further evaluation of these palpitations.  She has had palpitations for years. Wore a monitor for several weeks back in 2017  In Dec. Developed more palpitations Associated with a brief episode of lightheadedness.  Felt fatigued afterwards.  Has an occasional  "emptyness"  in the center of her chest  Works out regularly .   Has been going to [place] in [place] quickly , recovers quickly   Has had hyperlipidemia. Coronary calcium  score in Sept. 2019 was 0  Random episodes of chest pressure.   Not associated with exercise  May last for 5-10 minute Has been walking for year [place] up on a farm .   Now raises sweet potatoes.  Is a retired Investment banker, operational,,  Works PRN at [employer]  Non smoker.  Had pneumonia a year ago .  Labs from her primary medical doctor's office in December, 2020 reveals glucose of 80.  Creatinine is 0.66.  Sodium is 140.  Potassium is 5.0.  White blood cell count is 6.1.  Hemoglobin is 13.2.  TSH is 1.23.  She is reportedly had an x-ray at [place] [facility] although I cannot find that report on the chart.  He had supposedly so it showed some nodules that are being followed by her primary medical doctor.  August 19, 2019:  Sandra Park is seen today for follow-up of her episodes of shortness of breath. Had a stress test on June 08, 2019.  She had no ST or T wave changes.  She did have a hypertensive response to exercise.  Her pulse oximetry was normal throughout the exercise test. Echocardiogram reveals hyperdynamic left ventricular  function.  She has grade 1 diastolic dysfunction.  She was started on carvedilol  in response to her hypertensive response to exercise. Thinks her DOE is improved.   Takes her pulse ox while walking.   O2 sats are normal .  Tolerating the coreg  well   August 21, 2020: Sandra Park is seen for follow up of her DOE  Still exercising  Was in the hospital in Sept with lightheadedness.  Coronary CT angio in the hospital  Coronary calcium  score of 0. Normal coronaries  +PFO .   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   August 20, 2023 Sandra Park is seen for follow up of her chronic diastolic CHF Has a sweet potato farm ( brought some last year )  Has had some dyspnea Was started on Singular .  No leg swelling  Has some degenerative changes in her back     Past Medical History:  Diagnosis Date   Arthritis    Bilateral carotid artery disease (HCC) 08/19/2022   Carotid artery Ca2+ on CT in past  Carotid US  08/31/2022: R ICA 1-39; L ICA no stenosis   Chronic back pain    GERD (gastroesophageal reflux disease)    History of colitis 09/06/2014   History of kidney stones    Hypoproteinemia (HCC)    Left leg pain 08/19/2022   ABIs 08/31/2022: Normal bilaterally   Osteopenia    Osteoporosis    Pure hypercholesterolemia    Renal cyst    SVT (supraventricular tachycardia) (HCC)     Past Surgical  History:  Procedure Laterality Date   COLONOSCOPY  2006, 2016   normal   LAPAROSCOPY Left 1989,& 90's   salpingoopherectomy cyst x2    Current Medications: Current Meds  Medication Sig   acetaminophen  (TYLENOL ) 500 MG tablet Take 1,000 mg by mouth at bedtime.   albuterol  (VENTOLIN  HFA) 108 (90 Base) MCG/ACT inhaler Inhale 1 puff into the lungs by mouth every 6 hours   aspirin  EC 81 MG tablet Take 81 mg by mouth daily.   Calcium  Carbonate (CALCIUM  600 PO) Take 1 tablet by mouth daily.    carvedilol  (COREG ) 6.25 MG tablet Take 1 and 1/2 tablets by mouth twice daily   cetirizine (ZYRTEC) 10 MG tablet Take 10 mg by mouth 2 (two) times daily.   Cholecalciferol (VITAMIN D3) 1000 units CAPS Take 2,000 Units by mouth daily.   COVID-19 mRNA vaccine 2023-2024 (COMIRNATY ) syringe Inject into the muscle.   diclofenac  Sodium (VOLTAREN ) 1 % GEL Apply 2 g topically daily.   diclofenac  Sodium (VOLTAREN ) 1 % GEL APPLY 2 GRAMS TO THE AFFECTED AREA(S) 4 TIMES PER DAY   famotidine  (PEPCID ) 40 MG tablet Take 1 tablet (40 mg total) by mouth 2 (two) times daily.   fluticasone  (FLONASE ) 50 MCG/ACT nasal spray Place 1 spray into both nostrils 2 (two) times daily.   gabapentin  (NEURONTIN ) 100 MG capsule Take 1 - 3 capsules (100-300 mg) by mouth every evening.   ibuprofen (ADVIL) 400 MG tablet Take 400 mg by mouth as needed.   Krill Oil 500 MG CAPS    MAGNESIUM CITRATE PO Take 1 capsule by mouth daily.   montelukast  (SINGULAIR ) 10 MG tablet Take 1 tablet (10 mg total) by mouth at bedtime.   Multiple Vitamin (MULTIVITAMIN) tablet Take 1 tablet by mouth daily.   Polyethyl Glycol-Propyl Glycol (SYSTANE FREE OP) Place 1 drop into both eyes daily as needed (Dry eyes).   PROAIR  HFA 108 (90 Base) MCG/ACT inhaler Inhale 2 puffs into the lungs daily as needed for shortness of breath.    Probiotic Product (PROBIOTIC DAILY PO) Take 2 tablets by mouth daily.   rosuvastatin  (CRESTOR ) 10 MG tablet Take 1 tablet (10 mg total)  by mouth daily. *stop rosuvastatin  5mg *   Turmeric 500 MG CAPS Take 1,000 mg by mouth daily.   zinc gluconate 50 MG tablet Take 50 mg by mouth daily.   Zoledronic  Acid (RECLAST  IV) Infusion into the skin once yearly     Allergies:   Amoxicillin -pot clavulanate and Levofloxacin   Social History   Socioeconomic History  Marital status: Divorced    Spouse name: Not on file   Number of children: 0   Years of education: Not on file   Highest education level: Not on file  Occupational History    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[employer]  Tobacco Use   Smoking status: Never   Smokeless tobacco: Never  Substance and Sexual Activity   Alcohol use: No   Drug use: No   Sexual activity: Not on file  Other Topics Concern   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[employer]   No children   3-4 caffeine drinks/day   Social Drivers of Corporate investment banker Strain: Not on file  Food Insecurity: Not on file  Transportation Needs: Not on file  Physical Activity: Not on file  Stress: Not on file  Social Connections: Not on file     Family History: The patient's family history includes Arthritis in her father and mother; Atrial fibrillation in her father and mother; Clotting disorder in her father; Heart attack in her father; Heart disease in her maternal grandfather; Hypertension in her father, maternal grandfather, and mother; Pneumonia in her mother; Suicidality in her paternal grandmother. There is no history of Colon cancer.  ROS:   Please see the history of present illness.     All other systems reviewed and are negative.  EKGs/Labs/Other Studies Reviewed:    The following studies were reviewed today:   EKG:    EKG Interpretation Date/Time:  Wednesday August 20 2023 14:29:02 EDT Ventricular Rate:  52 PR Interval:  130 QRS Duration:  90 QT Interval:  396 QTC Calculation: 368 R Axis:   78  Text Interpretation: Sinus bradycardia Nonspecific T wave  abnormality When compared with ECG of 30-Dec-2019 13:26, No significant change since last tracing Confirmed by Ahmad Alert ([id number]) on 08/20/2023 2:35:08 PM    Recent Labs: No results found for requested labs within last 365 days.  Recent Lipid Panel No results found for: "CHOL", "TRIG", "HDL", "CHOLHDL", "VLDL", "LDLCALC", "LDLDIRECT"   Physical Exam: Blood pressure (!) 155/76, pulse (!) 47, height 5\' 5"  (1.651 m), weight 120 lb 9.6 oz (54.7 kg), SpO2 96%.  HYPERTENSION CONTROL Vitals:   08/20/23 1427 08/20/23 1439  BP: (!) 150/76 (!) 155/76    The patient's blood pressure is elevated above target today.  In order to address the patient's elevated BP: Blood pressure will be monitored at home to determine if medication changes need to be made.       GEN:  Well nourished, well developed in no acute distress HEENT: Normal NECK: No JVD; No carotid bruits LYMPHATICS: No lymphadenopathy CARDIAC: RRR   very soft systolic murmur  RESPIRATORY:  Clear to auscultation without rales, wheezing or rhonchi  ABDOMEN: Soft, non-tender, non-distended MUSCULOSKELETAL:  No edema; No deformity  SKIN: Warm and dry NEUROLOGIC:  Alert and oriented x 3     ASSESSMENT:    1. Follow-up exam   2. Chronic diastolic CHF (congestive heart failure), NYHA class 1 (HCC)     PLAN:       1.  Shortness of breath with exertion:    Has occasional DOE.  Seems stable   2.  Mitral regurgitation:   has a very soft systolic murmur on exam  and trial MR by echo in 2021 .  I don't think she will every need to have repair of her MV       Medication Adjustments/Labs  and Tests Ordered: Current medicines are reviewed at length with the patient today.  Concerns regarding medicines are outlined above.  Orders Placed This Encounter  Procedures   EKG 12-Lead   No orders of the defined types were placed in this encounter.   Patient Instructions  Follow-Up: At [facility], you and your  health needs are our priority.  As part of our continuing mission to provide you with exceptional heart care, our providers are all part of one team.  This team includes your primary Cardiologist (physician) and Advanced Practice Providers or APPs (Physician Assistants and Nurse Practitioners) who all work together to provide you with the care you need, when you need it.  Your next appointment:   1 year(s)  Provider:   Ahmad Alert, MD       1st Floor: - Lobby - Registration  - Pharmacy  - Lab - Cafe  2nd Floor: - PV Lab - Diagnostic Testing (echo, CT, nuclear med)  3rd Floor: - Vacant  4th Floor: - TCTS (cardiothoracic surgery) - AFib Clinic - Structural Heart Clinic - Vascular Surgery  - Vascular Ultrasound  5th Floor: - HeartCare Cardiology (general and EP) - Clinical Pharmacy for coumadin, hypertension, lipid, weight-loss medications, and med management appointments    Valet parking services will be available as well.     Signed, Ahmad Alert, MD  08/20/2023 6:04 PM    [facility]

## 2023-08-20 ENCOUNTER — Encounter: Payer: Self-pay | Admitting: Cardiovascular Disease

## 2023-08-20 ENCOUNTER — Ambulatory Visit: Payer: Medicare Other | Attending: Cardiovascular Disease | Admitting: Cardiovascular Disease

## 2023-08-20 VITALS — BP 155/76 | HR 47 | Ht 65.0 in | Wt 120.6 lb

## 2023-08-20 DIAGNOSIS — Z09 Encounter for follow-up examination after completed treatment for conditions other than malignant neoplasm: Secondary | ICD-10-CM | POA: Diagnosis not present

## 2023-08-20 DIAGNOSIS — I5032 Chronic diastolic (congestive) heart failure: Secondary | ICD-10-CM | POA: Diagnosis not present

## 2023-08-20 NOTE — Patient Instructions (Signed)
 Follow-Up: At [facility], you and your health needs are our priority.  As part of our continuing mission to provide you with exceptional heart care, our providers are all part of one team.  This team includes your primary Cardiologist (physician) and Advanced Practice Providers or APPs (Physician Assistants and Nurse Practitioners) who all work together to provide you with the care you need, when you need it.  Your next appointment:   1 year(s)  Provider:   Kristeen Miss, MD     1st Floor: - Lobby - Registration  - Pharmacy  - Lab - Cafe  2nd Floor: - PV Lab - Diagnostic Testing (echo, CT, nuclear med)  3rd Floor: - Vacant  4th Floor: - TCTS (cardiothoracic surgery) - AFib Clinic - Structural Heart Clinic - Vascular Surgery  - Vascular Ultrasound  5th Floor: - HeartCare Cardiology (general and EP) - Clinical Pharmacy for coumadin, hypertension, lipid, weight-loss medications, and med management appointments    Valet parking services will be available as well.

## 2023-09-03 DIAGNOSIS — M810 Age-related osteoporosis without current pathological fracture: Secondary | ICD-10-CM | POA: Diagnosis not present

## 2023-09-07 ENCOUNTER — Other Ambulatory Visit (HOSPITAL_COMMUNITY): Payer: Self-pay

## 2023-09-08 ENCOUNTER — Other Ambulatory Visit (HOSPITAL_COMMUNITY): Payer: Self-pay

## 2023-09-08 MED ORDER — GABAPENTIN 100 MG PO CAPS
ORAL_CAPSULE | ORAL | 3 refills | Status: AC
  Filled 2023-09-08: qty 90, 30d supply, fill #0
  Filled 2024-01-14: qty 90, 30d supply, fill #1
  Filled 2024-04-09: qty 90, 30d supply, fill #2

## 2023-09-15 ENCOUNTER — Other Ambulatory Visit (HOSPITAL_COMMUNITY): Payer: Self-pay

## 2023-09-15 DIAGNOSIS — H00014 Hordeolum externum left upper eyelid: Secondary | ICD-10-CM | POA: Diagnosis not present

## 2023-09-15 MED ORDER — OFLOXACIN 0.3 % OP SOLN
1.0000 [drp] | OPHTHALMIC | 0 refills | Status: AC
  Filled 2023-09-15: qty 5, 9d supply, fill #0

## 2023-09-17 ENCOUNTER — Other Ambulatory Visit (HOSPITAL_COMMUNITY): Payer: Self-pay

## 2023-09-17 DIAGNOSIS — R519 Headache, unspecified: Secondary | ICD-10-CM | POA: Diagnosis not present

## 2023-09-17 DIAGNOSIS — Z6820 Body mass index (BMI) 20.0-20.9, adult: Secondary | ICD-10-CM | POA: Diagnosis not present

## 2023-09-17 MED ORDER — CEFUROXIME AXETIL 500 MG PO TABS
500.0000 mg | ORAL_TABLET | Freq: Two times a day (BID) | ORAL | 0 refills | Status: AC
  Filled 2023-09-17: qty 10, 5d supply, fill #0

## 2023-09-30 DIAGNOSIS — K080 Exfoliation of teeth due to systemic causes: Secondary | ICD-10-CM | POA: Diagnosis not present

## 2023-10-13 ENCOUNTER — Other Ambulatory Visit: Payer: Self-pay

## 2023-10-13 ENCOUNTER — Other Ambulatory Visit (HOSPITAL_COMMUNITY): Payer: Self-pay

## 2023-10-14 ENCOUNTER — Other Ambulatory Visit (HOSPITAL_COMMUNITY): Payer: Self-pay

## 2023-10-14 MED ORDER — FAMOTIDINE 40 MG PO TABS
40.0000 mg | ORAL_TABLET | Freq: Two times a day (BID) | ORAL | 0 refills | Status: AC
  Filled 2023-10-14: qty 180, 90d supply, fill #0

## 2023-10-23 ENCOUNTER — Other Ambulatory Visit: Payer: Self-pay | Admitting: Family Medicine

## 2023-10-23 DIAGNOSIS — Z1231 Encounter for screening mammogram for malignant neoplasm of breast: Secondary | ICD-10-CM

## 2023-10-27 DIAGNOSIS — E7800 Pure hypercholesterolemia, unspecified: Secondary | ICD-10-CM | POA: Diagnosis not present

## 2023-10-27 DIAGNOSIS — M47816 Spondylosis without myelopathy or radiculopathy, lumbar region: Secondary | ICD-10-CM | POA: Diagnosis not present

## 2023-10-27 DIAGNOSIS — M810 Age-related osteoporosis without current pathological fracture: Secondary | ICD-10-CM | POA: Diagnosis not present

## 2023-10-27 DIAGNOSIS — I5032 Chronic diastolic (congestive) heart failure: Secondary | ICD-10-CM | POA: Diagnosis not present

## 2023-10-28 DIAGNOSIS — I5032 Chronic diastolic (congestive) heart failure: Secondary | ICD-10-CM | POA: Diagnosis not present

## 2023-10-29 DIAGNOSIS — M170 Bilateral primary osteoarthritis of knee: Secondary | ICD-10-CM | POA: Diagnosis not present

## 2023-10-30 DIAGNOSIS — L570 Actinic keratosis: Secondary | ICD-10-CM | POA: Diagnosis not present

## 2023-10-30 DIAGNOSIS — L821 Other seborrheic keratosis: Secondary | ICD-10-CM | POA: Diagnosis not present

## 2023-10-30 DIAGNOSIS — D225 Melanocytic nevi of trunk: Secondary | ICD-10-CM | POA: Diagnosis not present

## 2023-10-30 DIAGNOSIS — D3617 Benign neoplasm of peripheral nerves and autonomic nervous system of trunk, unspecified: Secondary | ICD-10-CM | POA: Diagnosis not present

## 2023-10-30 DIAGNOSIS — D485 Neoplasm of uncertain behavior of skin: Secondary | ICD-10-CM | POA: Diagnosis not present

## 2023-10-30 DIAGNOSIS — D0462 Carcinoma in situ of skin of left upper limb, including shoulder: Secondary | ICD-10-CM | POA: Diagnosis not present

## 2023-11-10 ENCOUNTER — Other Ambulatory Visit (HOSPITAL_COMMUNITY): Payer: Self-pay

## 2023-11-10 ENCOUNTER — Other Ambulatory Visit: Payer: Self-pay | Admitting: Cardiovascular Disease

## 2023-11-12 ENCOUNTER — Other Ambulatory Visit (HOSPITAL_COMMUNITY): Payer: Self-pay

## 2023-11-12 MED ORDER — CARVEDILOL 6.25 MG PO TABS
ORAL_TABLET | ORAL | 2 refills | Status: AC
  Filled 2023-11-12: qty 270, 90d supply, fill #0
  Filled 2024-02-10: qty 270, 90d supply, fill #1
  Filled 2024-05-04: qty 270, 90d supply, fill #2

## 2023-11-13 ENCOUNTER — Other Ambulatory Visit (HOSPITAL_COMMUNITY): Payer: Self-pay

## 2023-11-26 DIAGNOSIS — I5032 Chronic diastolic (congestive) heart failure: Secondary | ICD-10-CM | POA: Diagnosis not present

## 2023-11-27 DIAGNOSIS — I5032 Chronic diastolic (congestive) heart failure: Secondary | ICD-10-CM | POA: Diagnosis not present

## 2023-11-28 ENCOUNTER — Ambulatory Visit
Admission: RE | Admit: 2023-11-28 | Discharge: 2023-11-28 | Disposition: A | Discharge status: Home / Self Care | Source: Ambulatory Visit | Attending: Family Medicine | Admitting: Family Medicine

## 2023-11-28 DIAGNOSIS — Z1231 Encounter for screening mammogram for malignant neoplasm of breast: Secondary | ICD-10-CM

## 2023-12-04 DIAGNOSIS — D0462 Carcinoma in situ of skin of left upper limb, including shoulder: Secondary | ICD-10-CM | POA: Diagnosis not present

## 2023-12-26 DIAGNOSIS — I5032 Chronic diastolic (congestive) heart failure: Secondary | ICD-10-CM | POA: Diagnosis not present

## 2023-12-28 DIAGNOSIS — M810 Age-related osteoporosis without current pathological fracture: Secondary | ICD-10-CM | POA: Diagnosis not present

## 2023-12-28 DIAGNOSIS — I5032 Chronic diastolic (congestive) heart failure: Secondary | ICD-10-CM | POA: Diagnosis not present

## 2023-12-28 DIAGNOSIS — E7800 Pure hypercholesterolemia, unspecified: Secondary | ICD-10-CM | POA: Diagnosis not present

## 2023-12-28 DIAGNOSIS — M47816 Spondylosis without myelopathy or radiculopathy, lumbar region: Secondary | ICD-10-CM | POA: Diagnosis not present

## 2024-01-08 DIAGNOSIS — R0981 Nasal congestion: Secondary | ICD-10-CM | POA: Diagnosis not present

## 2024-01-08 DIAGNOSIS — Z23 Encounter for immunization: Secondary | ICD-10-CM | POA: Diagnosis not present

## 2024-01-08 DIAGNOSIS — M810 Age-related osteoporosis without current pathological fracture: Secondary | ICD-10-CM | POA: Diagnosis not present

## 2024-01-08 DIAGNOSIS — E7800 Pure hypercholesterolemia, unspecified: Secondary | ICD-10-CM | POA: Diagnosis not present

## 2024-01-10 ENCOUNTER — Other Ambulatory Visit (HOSPITAL_COMMUNITY): Payer: Self-pay

## 2024-01-13 ENCOUNTER — Other Ambulatory Visit (HOSPITAL_COMMUNITY): Payer: Self-pay

## 2024-01-13 MED ORDER — ROSUVASTATIN CALCIUM 20 MG PO TABS
20.0000 mg | ORAL_TABLET | Freq: Every day | ORAL | 0 refills | Status: DC
  Filled 2024-01-13: qty 90, 90d supply, fill #0

## 2024-01-14 ENCOUNTER — Other Ambulatory Visit (HOSPITAL_COMMUNITY): Payer: Self-pay

## 2024-01-14 MED ORDER — MONTELUKAST SODIUM 10 MG PO TABS
10.0000 mg | ORAL_TABLET | Freq: Every day | ORAL | 0 refills | Status: DC
  Filled 2024-01-14: qty 90, 90d supply, fill #0

## 2024-01-14 MED ORDER — FLUTICASONE PROPIONATE 50 MCG/ACT NA SUSP
1.0000 | Freq: Two times a day (BID) | NASAL | 1 refills | Status: AC
  Filled 2024-01-14: qty 48, 90d supply, fill #0

## 2024-01-15 ENCOUNTER — Other Ambulatory Visit (HOSPITAL_COMMUNITY): Payer: Self-pay

## 2024-01-15 MED ORDER — ESOMEPRAZOLE MAGNESIUM 40 MG PO CPDR
40.0000 mg | DELAYED_RELEASE_CAPSULE | Freq: Every day | ORAL | 1 refills | Status: AC
  Filled 2024-01-15: qty 90, 90d supply, fill #0
  Filled 2024-04-09: qty 90, 90d supply, fill #1

## 2024-01-15 MED ORDER — ALBUTEROL SULFATE HFA 108 (90 BASE) MCG/ACT IN AERS
1.0000 | INHALATION_SPRAY | Freq: Four times a day (QID) | RESPIRATORY_TRACT | 2 refills | Status: AC | PRN
  Filled 2024-01-15: qty 6.7, 17d supply, fill #0
  Filled 2024-01-15: qty 6.7, 20d supply, fill #0

## 2024-01-22 DIAGNOSIS — G4733 Obstructive sleep apnea (adult) (pediatric): Secondary | ICD-10-CM | POA: Diagnosis not present

## 2024-01-25 DIAGNOSIS — I5032 Chronic diastolic (congestive) heart failure: Secondary | ICD-10-CM | POA: Diagnosis not present

## 2024-01-27 DIAGNOSIS — I5032 Chronic diastolic (congestive) heart failure: Secondary | ICD-10-CM | POA: Diagnosis not present

## 2024-01-27 DIAGNOSIS — E7800 Pure hypercholesterolemia, unspecified: Secondary | ICD-10-CM | POA: Diagnosis not present

## 2024-01-27 DIAGNOSIS — M810 Age-related osteoporosis without current pathological fracture: Secondary | ICD-10-CM | POA: Diagnosis not present

## 2024-01-27 DIAGNOSIS — M47816 Spondylosis without myelopathy or radiculopathy, lumbar region: Secondary | ICD-10-CM | POA: Diagnosis not present

## 2024-02-05 DIAGNOSIS — Z1331 Encounter for screening for depression: Secondary | ICD-10-CM | POA: Diagnosis not present

## 2024-02-05 DIAGNOSIS — Z0000 Encounter for general adult medical examination without abnormal findings: Secondary | ICD-10-CM | POA: Diagnosis not present

## 2024-02-13 ENCOUNTER — Other Ambulatory Visit (HOSPITAL_COMMUNITY): Payer: Self-pay

## 2024-02-24 DIAGNOSIS — I5032 Chronic diastolic (congestive) heart failure: Secondary | ICD-10-CM | POA: Diagnosis not present

## 2024-02-26 DIAGNOSIS — H52203 Unspecified astigmatism, bilateral: Secondary | ICD-10-CM | POA: Diagnosis not present

## 2024-02-27 DIAGNOSIS — M810 Age-related osteoporosis without current pathological fracture: Secondary | ICD-10-CM | POA: Diagnosis not present

## 2024-02-27 DIAGNOSIS — E7800 Pure hypercholesterolemia, unspecified: Secondary | ICD-10-CM | POA: Diagnosis not present

## 2024-02-27 DIAGNOSIS — M47816 Spondylosis without myelopathy or radiculopathy, lumbar region: Secondary | ICD-10-CM | POA: Diagnosis not present

## 2024-02-27 DIAGNOSIS — I5032 Chronic diastolic (congestive) heart failure: Secondary | ICD-10-CM | POA: Diagnosis not present

## 2024-03-01 ENCOUNTER — Other Ambulatory Visit (HOSPITAL_COMMUNITY): Payer: Self-pay

## 2024-03-01 ENCOUNTER — Other Ambulatory Visit: Payer: Self-pay

## 2024-03-01 MED ORDER — DICLOFENAC SODIUM 1 % EX GEL
2.0000 g | Freq: Four times a day (QID) | CUTANEOUS | 1 refills | Status: AC
  Filled 2024-03-01: qty 1000, 63d supply, fill #0

## 2024-03-03 DIAGNOSIS — J069 Acute upper respiratory infection, unspecified: Secondary | ICD-10-CM | POA: Diagnosis not present

## 2024-03-03 DIAGNOSIS — Z6820 Body mass index (BMI) 20.0-20.9, adult: Secondary | ICD-10-CM | POA: Diagnosis not present

## 2024-03-03 DIAGNOSIS — R0989 Other specified symptoms and signs involving the circulatory and respiratory systems: Secondary | ICD-10-CM | POA: Diagnosis not present

## 2024-03-05 ENCOUNTER — Other Ambulatory Visit (HOSPITAL_COMMUNITY): Payer: Self-pay

## 2024-03-25 DIAGNOSIS — I5032 Chronic diastolic (congestive) heart failure: Secondary | ICD-10-CM | POA: Diagnosis not present

## 2024-03-28 DIAGNOSIS — E7800 Pure hypercholesterolemia, unspecified: Secondary | ICD-10-CM | POA: Diagnosis not present

## 2024-03-28 DIAGNOSIS — I5032 Chronic diastolic (congestive) heart failure: Secondary | ICD-10-CM | POA: Diagnosis not present

## 2024-03-28 DIAGNOSIS — M47816 Spondylosis without myelopathy or radiculopathy, lumbar region: Secondary | ICD-10-CM | POA: Diagnosis not present

## 2024-03-28 DIAGNOSIS — M810 Age-related osteoporosis without current pathological fracture: Secondary | ICD-10-CM | POA: Diagnosis not present

## 2024-04-01 DIAGNOSIS — K080 Exfoliation of teeth due to systemic causes: Secondary | ICD-10-CM | POA: Diagnosis not present

## 2024-04-09 ENCOUNTER — Other Ambulatory Visit (HOSPITAL_COMMUNITY): Payer: Self-pay

## 2024-04-12 ENCOUNTER — Other Ambulatory Visit (HOSPITAL_COMMUNITY): Payer: Self-pay

## 2024-04-12 ENCOUNTER — Other Ambulatory Visit: Payer: Self-pay

## 2024-04-12 MED ORDER — MONTELUKAST SODIUM 10 MG PO TABS
10.0000 mg | ORAL_TABLET | Freq: Every day | ORAL | 0 refills | Status: AC
  Filled 2024-04-12: qty 90, 90d supply, fill #0

## 2024-04-12 MED ORDER — ROSUVASTATIN CALCIUM 20 MG PO TABS
20.0000 mg | ORAL_TABLET | Freq: Every day | ORAL | 0 refills | Status: AC
  Filled 2024-04-12: qty 90, 90d supply, fill #0

## 2024-04-13 DIAGNOSIS — E7800 Pure hypercholesterolemia, unspecified: Secondary | ICD-10-CM | POA: Diagnosis not present

## 2024-04-14 ENCOUNTER — Other Ambulatory Visit (HOSPITAL_COMMUNITY): Payer: Self-pay

## 2024-04-20 DIAGNOSIS — K080 Exfoliation of teeth due to systemic causes: Secondary | ICD-10-CM | POA: Diagnosis not present

## 2024-05-03 DIAGNOSIS — M810 Age-related osteoporosis without current pathological fracture: Secondary | ICD-10-CM

## 2024-05-04 ENCOUNTER — Other Ambulatory Visit (HOSPITAL_COMMUNITY): Payer: Self-pay

## 2024-06-28 ENCOUNTER — Other Ambulatory Visit (HOSPITAL_BASED_OUTPATIENT_CLINIC_OR_DEPARTMENT_OTHER)

## 2024-08-19 ENCOUNTER — Ambulatory Visit: Admitting: Internal Medicine
